# Patient Record
Sex: Female | Born: 1940 | Race: White | Hispanic: No | Marital: Married | Smoking: Never smoker
Health system: Southern US, Community
[De-identification: ages and names within clinical notes are randomized; demographics above are authoritative.]

## PROBLEM LIST (undated history)

## (undated) DIAGNOSIS — I10 Essential (primary) hypertension: Secondary | ICD-10-CM

## (undated) DIAGNOSIS — E7800 Pure hypercholesterolemia, unspecified: Secondary | ICD-10-CM

---

## 2019-07-01 ENCOUNTER — Emergency Department (HOSPITAL_BASED_OUTPATIENT_CLINIC_OR_DEPARTMENT_OTHER): Payer: Medicare Other

## 2019-07-01 ENCOUNTER — Emergency Department (HOSPITAL_BASED_OUTPATIENT_CLINIC_OR_DEPARTMENT_OTHER)
Admission: EM | Admit: 2019-07-01 | Discharge: 2019-07-01 | Disposition: A | Discharge status: Home / Self Care | Payer: Medicare Other | Attending: Emergency Medicine | Admitting: Emergency Medicine

## 2019-07-01 ENCOUNTER — Encounter (HOSPITAL_BASED_OUTPATIENT_CLINIC_OR_DEPARTMENT_OTHER): Payer: Self-pay | Admitting: *Deleted

## 2019-07-01 ENCOUNTER — Other Ambulatory Visit: Payer: Self-pay

## 2019-07-01 DIAGNOSIS — R1030 Lower abdominal pain, unspecified: Secondary | ICD-10-CM | POA: Diagnosis not present

## 2019-07-01 DIAGNOSIS — I10 Essential (primary) hypertension: Secondary | ICD-10-CM | POA: Insufficient documentation

## 2019-07-01 DIAGNOSIS — R102 Pelvic and perineal pain: Secondary | ICD-10-CM

## 2019-07-01 DIAGNOSIS — R109 Unspecified abdominal pain: Secondary | ICD-10-CM | POA: Diagnosis present

## 2019-07-01 HISTORY — DX: Essential (primary) hypertension: I10

## 2019-07-01 HISTORY — DX: Pure hypercholesterolemia, unspecified: E78.00

## 2019-07-01 LAB — CBC WITH DIFFERENTIAL/PLATELET
Abs Immature Granulocytes: 0.03 10*3/uL (ref 0.00–0.07)
Basophils Absolute: 0 10*3/uL (ref 0.0–0.1)
Basophils Relative: 1 %
Eosinophils Absolute: 0.1 10*3/uL (ref 0.0–0.5)
Eosinophils Relative: 1 %
HCT: 45.7 % (ref 36.0–46.0)
Hemoglobin: 13.9 g/dL (ref 12.0–15.0)
Immature Granulocytes: 0 %
Lymphocytes Relative: 25 %
Lymphs Abs: 1.9 10*3/uL (ref 0.7–4.0)
MCH: 26.6 pg (ref 26.0–34.0)
MCHC: 30.4 g/dL (ref 30.0–36.0)
MCV: 87.4 fL (ref 80.0–100.0)
Monocytes Absolute: 0.7 10*3/uL (ref 0.1–1.0)
Monocytes Relative: 9 %
Neutro Abs: 4.9 10*3/uL (ref 1.7–7.7)
Neutrophils Relative %: 64 %
Platelets: 216 10*3/uL (ref 150–400)
RBC: 5.23 MIL/uL — ABNORMAL HIGH (ref 3.87–5.11)
RDW: 14 % (ref 11.5–15.5)
WBC: 7.6 10*3/uL (ref 4.0–10.5)
nRBC: 0 % (ref 0.0–0.2)

## 2019-07-01 LAB — URINALYSIS, MICROSCOPIC (REFLEX)

## 2019-07-01 LAB — URINALYSIS, ROUTINE W REFLEX MICROSCOPIC
Bilirubin Urine: NEGATIVE
Glucose, UA: NEGATIVE mg/dL
Ketones, ur: NEGATIVE mg/dL
Leukocytes,Ua: NEGATIVE
Nitrite: NEGATIVE
Protein, ur: NEGATIVE mg/dL
Specific Gravity, Urine: <1.005 — ABNORMAL LOW (ref 1.005–1.030)
pH: 6 (ref 5.0–8.0)

## 2019-07-01 LAB — BASIC METABOLIC PANEL
Anion gap: 12 (ref 5–15)
BUN: 15 mg/dL (ref 8–23)
CO2: 25 mmol/L (ref 22–32)
Calcium: 9.1 mg/dL (ref 8.9–10.3)
Chloride: 104 mmol/L (ref 98–111)
Creatinine, Ser: 0.98 mg/dL (ref 0.44–1.00)
GFR calc Af Amer: >60 mL/min (ref >60)
GFR calc non Af Amer: 55 mL/min — ABNORMAL LOW (ref >60)
Glucose, Bld: 113 mg/dL — ABNORMAL HIGH (ref 70–99)
Potassium: 3.8 mmol/L (ref 3.5–5.1)
Sodium: 141 mmol/L (ref 135–145)

## 2019-07-01 MED ORDER — IOHEXOL 300 MG/ML  SOLN
100.0000 mL | Freq: Once | INTRAMUSCULAR | Status: AC
  Administered 2019-07-01: 80 mL via INTRAVENOUS

## 2019-07-01 MED ORDER — KETOROLAC TROMETHAMINE 15 MG/ML IJ SOLN
7.5000 mg | Freq: Once | INTRAMUSCULAR | Status: AC
  Administered 2019-07-01: 05:00:00 7.5 mg via INTRAVENOUS

## 2019-07-01 MED ORDER — KETOROLAC TROMETHAMINE 15 MG/ML IJ SOLN
INTRAMUSCULAR | Status: AC
  Filled 2019-07-01: qty 1

## 2019-07-01 MED ORDER — MELOXICAM 7.5 MG PO TABS: ORAL_TABLET | ORAL | 0 refills | Status: DC

## 2019-07-01 MED ORDER — FENTANYL CITRATE (PF) 100 MCG/2ML IJ SOLN
50.0000 ug | INTRAMUSCULAR | Status: DC | PRN
  Administered 2019-07-01: 03:00:00 50 ug via INTRAVENOUS
  Filled 2019-07-01: qty 2

## 2019-07-01 MED ORDER — SODIUM CHLORIDE 0.9 % IV SOLN
Freq: Once | INTRAVENOUS | Status: AC
  Administered 2019-07-01: 03:00:00 via INTRAVENOUS

## 2019-07-01 MED ORDER — ONDANSETRON HCL 4 MG/2ML IJ SOLN
4.0000 mg | Freq: Once | INTRAMUSCULAR | Status: AC
  Administered 2019-07-01: 4 mg via INTRAVENOUS
  Filled 2019-07-01: qty 2

## 2019-07-01 NOTE — ED Notes (Signed)
Updated pt's husband on pt condition and plan of care

## 2019-07-01 NOTE — ED Triage Notes (Signed)
Pt reports pubic pain with tenderness on palpation and movement x 2-3 days. Also reports low back pain. Denies urinary symptoms.

## 2019-07-01 NOTE — ED Provider Notes (Signed)
[facility] Provider Note: Georgena Spurling, MD, FACEP  CSN: [id number] MRN: [id number] ARRIVAL: 07/01/19 at [place]: [place]  Abdominal Pain   HISTORY OF PRESENT ILLNESS  07/01/19 2:26 AM Susan Mayer is a 78 y.o. female with 2 to 3 days of suprapubic pain.  The pain is dull in nature and worse with movement or palpation.  The pain acutely worsened this morning about 1 AM and she rates it as a 7 out of 10 at its worst; it is much better when lying still.  She has had some transient nausea and generalized shaking this morning as well.  She denies vomiting or diarrhea.  She denies dysuria or hematuria.  She denies vaginal bleeding or discharge; she is postmenopausal.  She has a history of chronic back pain but states her back pain has worsened along with the development of pain in her suprapubic region.     Past Medical History:  Diagnosis Date  . Hypercholesteremia   . Hypertension     History reviewed. No pertinent surgical history.  History reviewed. No pertinent family history.  Social History   Tobacco Use  . Smoking status: Never Smoker  . Smokeless tobacco: Never Used  Substance Use Topics  . Alcohol use: Never    Frequency: Never  . Drug use: Never    Prior to Admission medications   Not on File    Allergies Penicillins and Sulfa antibiotics   REVIEW OF SYSTEMS  Negative except as noted here or in the History of Present Illness.   PHYSICAL EXAMINATION  Initial Vital Signs Blood pressure (!) 144/93, pulse 98, temperature 99 F (37.2 C), temperature source Oral, resp. rate 18, height 5\' 5"  (1.651 m), weight 63.5 kg, SpO2 100 %.  Examination General: Well-developed, well-nourished female in no acute distress; appearance consistent with age of record HENT: normocephalic; atraumatic Eyes: pupils equal, round and reactive to light; extraocular muscles intact; bilateral pseudophakia Neck: supple Heart: regular rate and  rhythm Lungs: clear to auscultation bilaterally Abdomen: soft; nondistended; suprapubic tenderness; no masses or hepatosplenomegaly; bowel sounds present Extremities: No deformity; full range of motion; pulses normal Neurologic: Awake, alert and oriented; motor function intact in all extremities and symmetric; no facial droop Skin: Warm and dry Psychiatric: Normal mood and affect   RESULTS  Summary of this visit's results, reviewed by myself:   EKG Interpretation  Date/Time:    Ventricular Rate:    PR Interval:    QRS Duration:   QT Interval:    QTC Calculation:   R Axis:     Text Interpretation:        Laboratory Studies: Results for orders placed or performed during the hospital encounter of 07/01/19 (from the past 24 hour(s))  Urinalysis, Routine w reflex microscopic     Status: Abnormal   Collection Time: 07/01/19  2:42 AM  Result Value Ref Range   Color, Urine YELLOW YELLOW   APPearance CLEAR CLEAR   Specific Gravity, Urine <1.005 (L) 1.005 - 1.030   pH 6.0 5.0 - 8.0   Glucose, UA NEGATIVE NEGATIVE mg/dL   Hgb urine dipstick TRACE (A) NEGATIVE   Bilirubin Urine NEGATIVE NEGATIVE   Ketones, ur NEGATIVE NEGATIVE mg/dL   Protein, ur NEGATIVE NEGATIVE mg/dL   Nitrite NEGATIVE NEGATIVE   Leukocytes,Ua NEGATIVE NEGATIVE  CBC with Differential/Platelet     Status: Abnormal   Collection Time: 07/01/19  2:42 AM  Result Value Ref Range   WBC 7.6 4.0 -  10.5 K/uL   RBC 5.23 (H) 3.87 - 5.11 MIL/uL   Hemoglobin 13.9 12.0 - 15.0 g/dL   HCT 16.145.7 09.636.0 - 04.546.0 %   MCV 87.4 80.0 - 100.0 fL   MCH 26.6 26.0 - 34.0 pg   MCHC 30.4 30.0 - 36.0 g/dL   RDW 40.914.0 81.111.5 - 91.415.5 %   Platelets 216 150 - 400 K/uL   nRBC 0.0 0.0 - 0.2 %   Neutrophils Relative % 64 %   Neutro Abs 4.9 1.7 - 7.7 K/uL   Lymphocytes Relative 25 %   Lymphs Abs 1.9 0.7 - 4.0 K/uL   Monocytes Relative 9 %   Monocytes Absolute 0.7 0.1 - 1.0 K/uL   Eosinophils Relative 1 %   Eosinophils Absolute 0.1 0.0 - 0.5  K/uL   Basophils Relative 1 %   Basophils Absolute 0.0 0.0 - 0.1 K/uL   Immature Granulocytes 0 %   Abs Immature Granulocytes 0.03 0.00 - 0.07 K/uL  Basic metabolic panel     Status: Abnormal   Collection Time: 07/01/19  2:42 AM  Result Value Ref Range   Sodium 141 135 - 145 mmol/L   Potassium 3.8 3.5 - 5.1 mmol/L   Chloride 104 98 - 111 mmol/L   CO2 25 22 - 32 mmol/L   Glucose, Bld 113 (H) 70 - 99 mg/dL   BUN 15 8 - 23 mg/dL   Creatinine, Ser 7.820.98 0.44 - 1.00 mg/dL   Calcium 9.1 8.9 - 95.610.3 mg/dL   GFR calc non Af Amer 55 (L) >60 mL/min   GFR calc Af Amer >60 >60 mL/min   Anion gap 12 5 - 15  Urinalysis, Microscopic (reflex)     Status: Abnormal   Collection Time: 07/01/19  2:42 AM  Result Value Ref Range   RBC / HPF 0-5 0 - 5 RBC/hpf   WBC, UA 0-5 0 - 5 WBC/hpf   Bacteria, UA RARE (A) NONE SEEN   Squamous Epithelial / LPF 0-5 0 - 5   Imaging Studies: Ct Abdomen Pelvis W Contrast  Result Date: 07/01/2019 CLINICAL DATA:  Suprapubic pain.  Nausea and chills. EXAM: CT ABDOMEN AND PELVIS WITH CONTRAST TECHNIQUE: Multidetector CT imaging of the abdomen and pelvis was performed using the standard protocol following bolus administration of intravenous contrast. CONTRAST:  80mL OMNIPAQUE IOHEXOL 300 MG/ML  SOLN COMPARISON:  None. FINDINGS: Lower chest: The lung bases are clear. The heart size is normal. Hepatobiliary: The liver is normal. Normal gallbladder.There is no biliary ductal dilation. Pancreas: Normal contours without ductal dilatation. No peripancreatic fluid collection. Spleen: No splenic laceration or hematoma. Adrenals/Urinary Tract: --Adrenal glands: No adrenal hemorrhage. --Right kidney/ureter: No hydronephrosis or perinephric hematoma. --Left kidney/ureter: No hydronephrosis or perinephric hematoma. --Urinary bladder: Unremarkable. Stomach/Bowel: --Stomach/Duodenum: No hiatal hernia or other gastric abnormality. Normal duodenal course and caliber. --Small bowel: No dilatation or  inflammation. --Colon: No focal abnormality. --Appendix: Normal. Vascular/Lymphatic: Atherosclerotic calcification is present within the non-aneurysmal abdominal aorta, without hemodynamically significant stenosis. --No retroperitoneal lymphadenopathy. --No mesenteric lymphadenopathy. --No pelvic or inguinal lymphadenopathy. Reproductive: Unremarkable Other: No ascites or free air. There is a small fat containing periumbilical hernia. Musculoskeletal. There is grade 1 anterolisthesis of L3 on L4. There is multilevel disc height loss. No acute osseous abnormality. IMPRESSION: No acute intra-abdominal abnormality detected. No findings to explain the patient's suprapubic abdominal pain. Electronically Signed   By: Katherine Mantlehristopher  Green M.D.   On: 07/01/2019 03:52    ED COURSE and MDM  Nursing notes  and initial vitals signs, including pulse oximetry, reviewed.  Vitals:   07/01/19 0221 07/01/19 0222 07/01/19 0441  BP: (!) 144/93  125/79  Pulse: 98  67  Resp: 18  16  Temp: 99 F (37.2 C)  98.6 F (37 C)  TempSrc: Oral  Oral  SpO2: 100%  100%  Weight:  63.5 kg   Height:  5\' 5"  (1.651 m)    4:28 AM CT scan and laboratory studies are unremarkable.  On reexamination the patient has no tenderness of her labia majora but does have tenderness of the suprapubic and mons pubis regions without erythema, swelling, warmth or crepitus.  There is tenderness of the right lower back just medial to the sacroiliac joint.  This could represent sacroiliac pain radiating to the anterior but the pattern is atypical for sacroiliitis.  Examination is not consistent with cellulitis or other soft tissue infection and I would have expected to see evidence of such on the CT scan.  5:24 AM Significant relief with Toradol 7.5 mg IV.  We will have patient follow-up with her PCP or return if symptoms are worsening or changing.  PROCEDURES    ED DIAGNOSES     ICD-10-CM   1. Suprapubic abdominal pain  R10.2        Lalena Salas,  Jonny RuizJohn, MD 07/01/19 [phone]

## 2019-07-01 NOTE — ED Notes (Signed)
Went to parking lot and updated pt's husband on pt condition and plan of care

## 2019-07-01 NOTE — ED Notes (Signed)
Patient transported to CT 

## 2020-07-27 IMAGING — CT CT ABDOMEN AND PELVIS WITH CONTRAST
2 of 5 series · 16 of 46 positions shown, 18 images · IV contrast (APPLIED)
Comparison: None.

CLINICAL DATA: Suprapubic pain.  Nausea and chills.

EXAM:
CT ABDOMEN AND PELVIS WITH CONTRAST
TECHNIQUE: Multidetector CT imaging of the abdomen and pelvis was performed
using the standard protocol following bolus administration of
intravenous contrast.
CONTRAST:  80mL OMNIPAQUE IOHEXOL 300 MG/ML  SOLN

[Series 2: axial st · axial · 0.65mm/px · z∈[+890,+1255]mm · 13 of 83 slices shown, 15 images]
[im 5/83  soft-tissue]
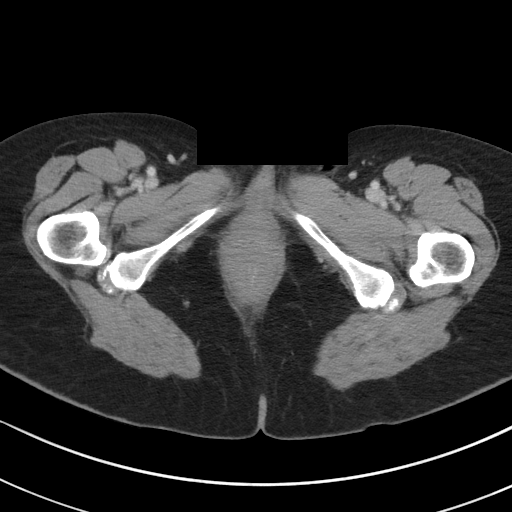
[im 5/83  bone]
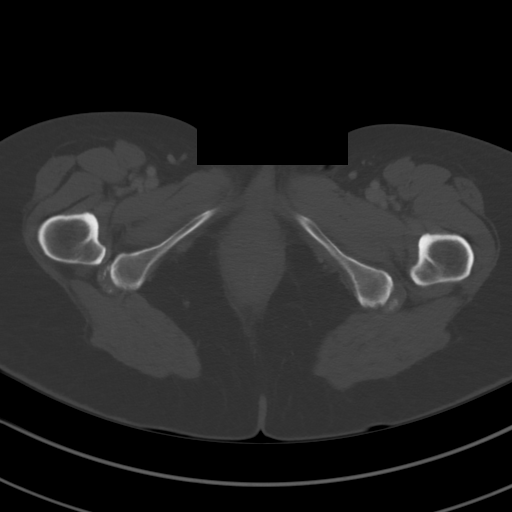
[im 13/83  soft-tissue]
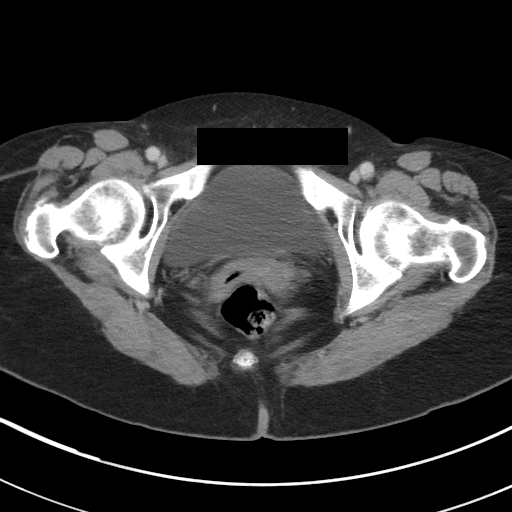
[im 17/83  soft-tissue]
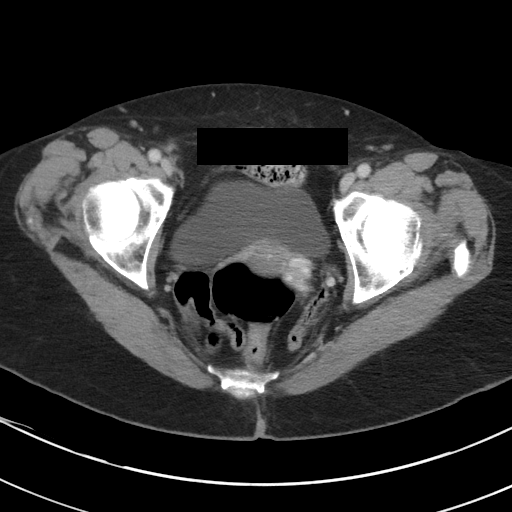
[im 25/83  soft-tissue]
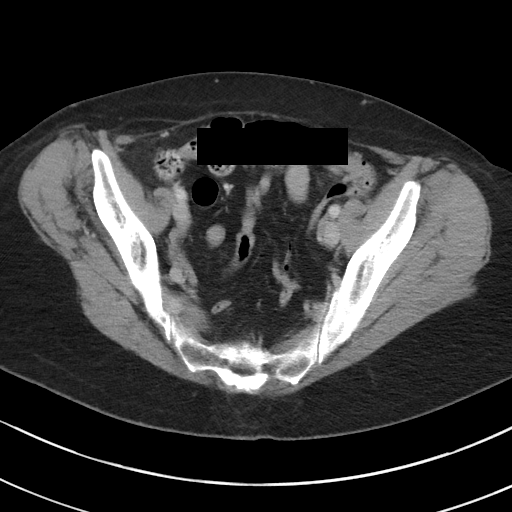
[im 29/83  soft-tissue]
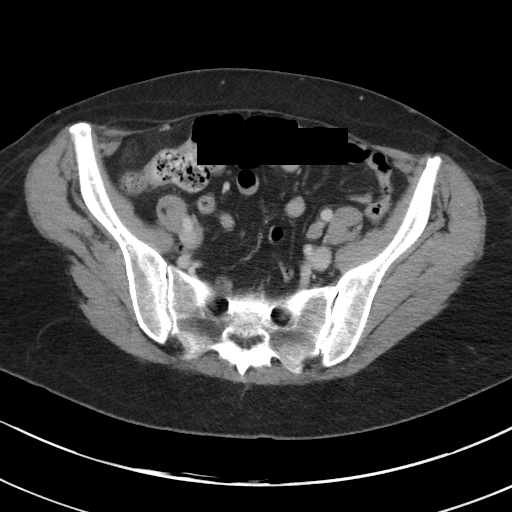
[im 37/83  soft-tissue]
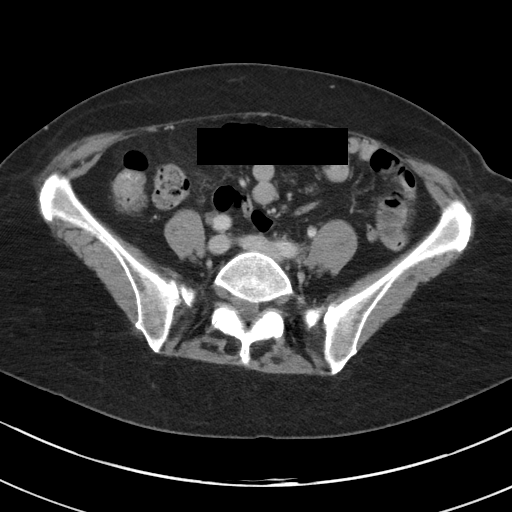
[im 42/83  soft-tissue]
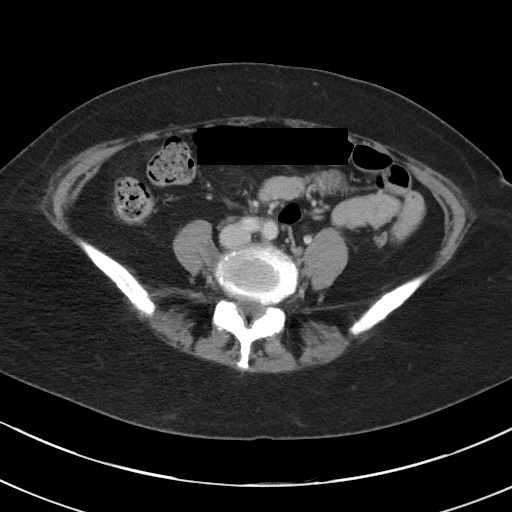
[im 46/83  soft-tissue]
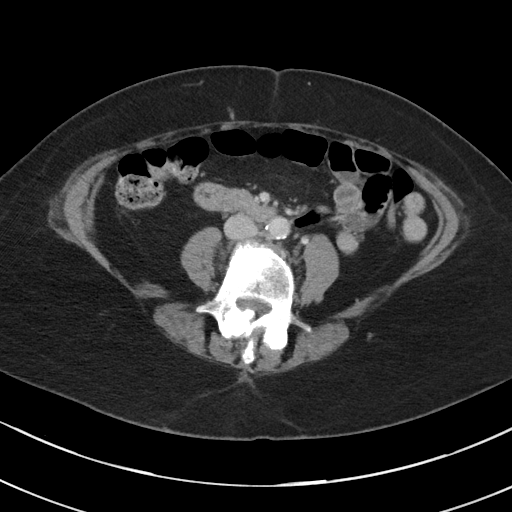
[im 54/83  soft-tissue]
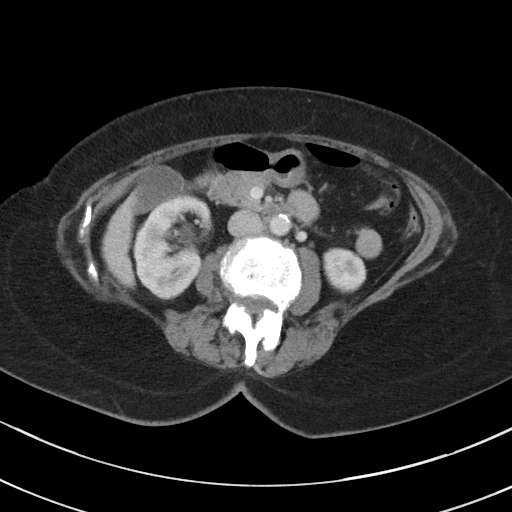
[im 54/83  bone]
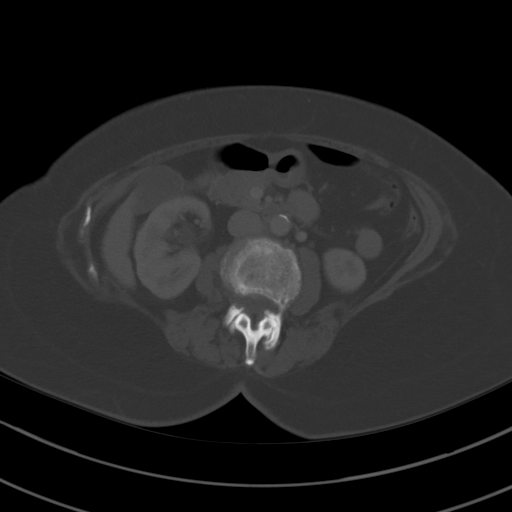
[im 58/83  soft-tissue]
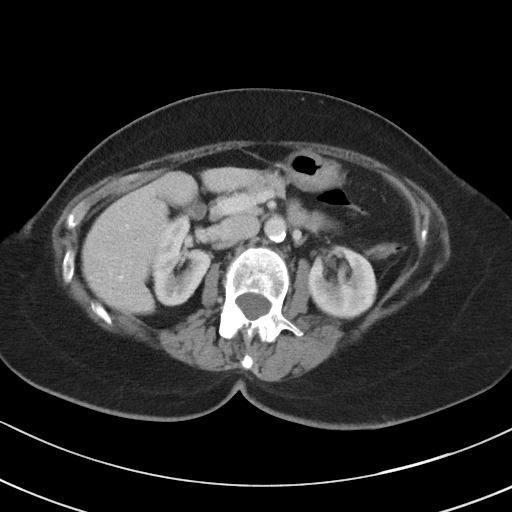
[im 66/83  soft-tissue]
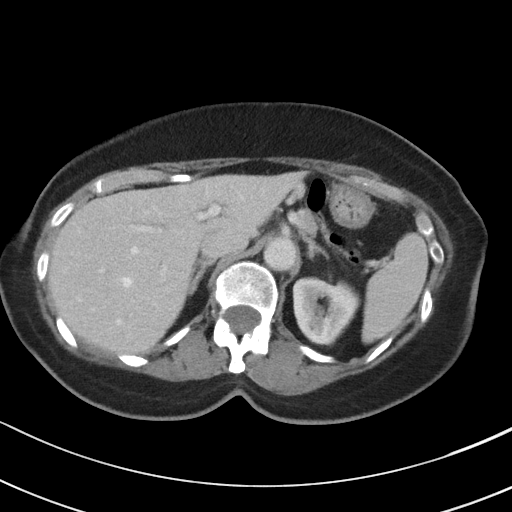
[im 70/83  soft-tissue]
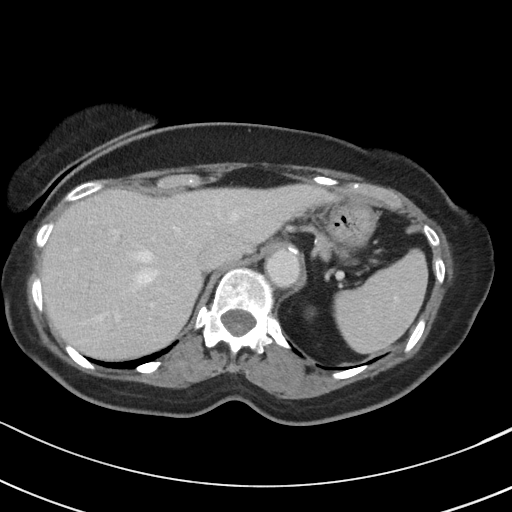
[im 78/83  soft-tissue]
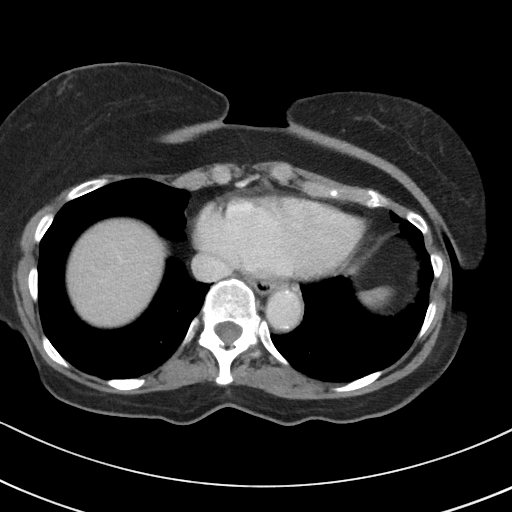

[Series 5: coronal st · coronal · 0.65mm/px · 3 of 83 slices shown]
[im 28/83  soft-tissue]
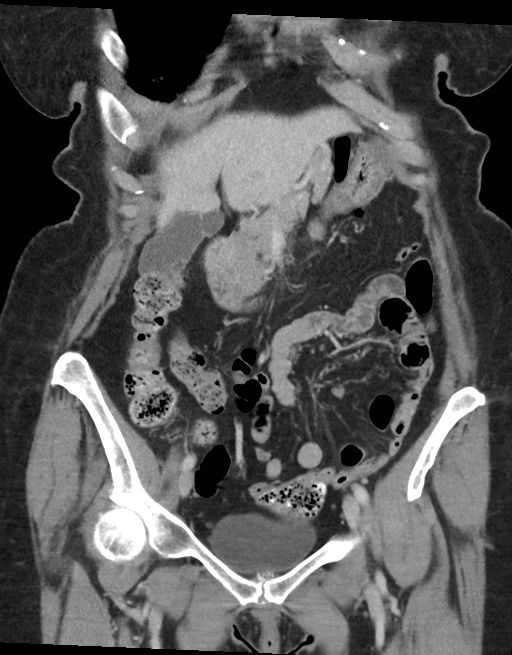
[im 37/83  soft-tissue]
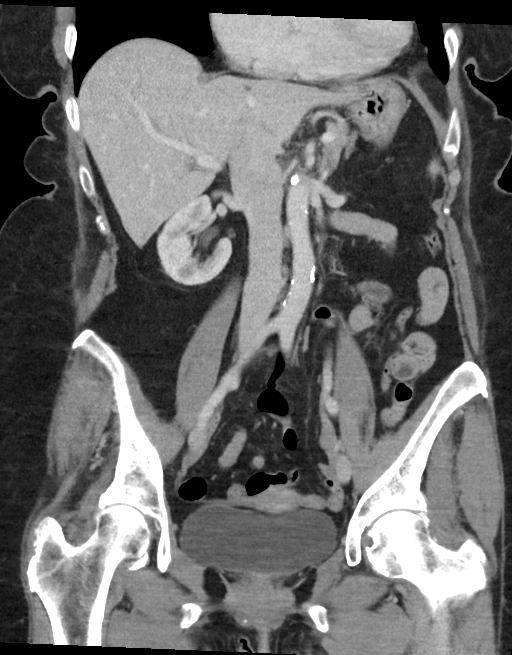
[im 46/83  soft-tissue]
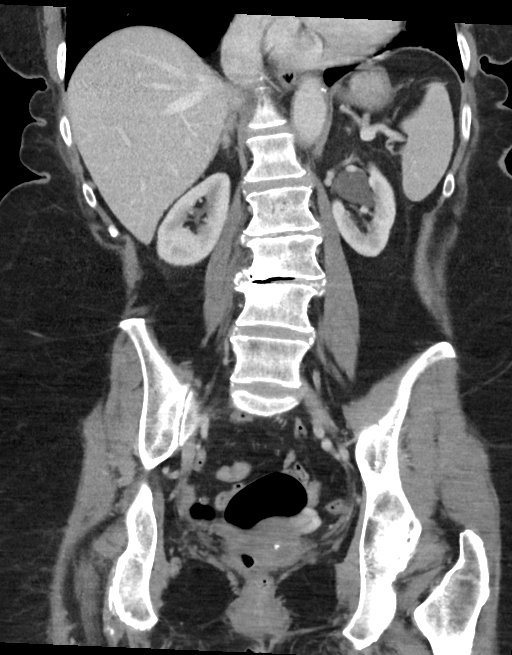

[16 of 46 positions shown; findings below may reference images not displayed]

FINDINGS: Lower chest: The lung bases are clear. The heart size is normal.

Hepatobiliary: The liver is normal. Normal gallbladder.There is no
biliary ductal dilation.

Pancreas: Normal contours without ductal dilatation. No
peripancreatic fluid collection.

Spleen: No splenic laceration or hematoma.

Adrenals/Urinary Tract:

--Adrenal glands: No adrenal hemorrhage.

--Right kidney/ureter: No hydronephrosis or perinephric hematoma.

--Left kidney/ureter: No hydronephrosis or perinephric hematoma.

--Urinary bladder: Unremarkable.

Stomach/Bowel:

--Stomach/Duodenum: No hiatal hernia or other gastric abnormality.
Normal duodenal course and caliber.

--Small bowel: No dilatation or inflammation.

--Colon: No focal abnormality.

--Appendix: Normal.

Vascular/Lymphatic: Atherosclerotic calcification is present within
the non-aneurysmal abdominal aorta, without hemodynamically
significant stenosis.

--No retroperitoneal lymphadenopathy.

--No mesenteric lymphadenopathy.

--No pelvic or inguinal lymphadenopathy.

Reproductive: Unremarkable

Other: No ascites or free air. There is a small fat containing
periumbilical hernia.

Musculoskeletal. There is grade 1 anterolisthesis of L3 on L4. There
is multilevel disc height loss. No acute osseous abnormality.
IMPRESSION: No acute intra-abdominal abnormality detected. No findings to
explain the patient's suprapubic abdominal pain.

## 2021-02-14 ENCOUNTER — Emergency Department (HOSPITAL_BASED_OUTPATIENT_CLINIC_OR_DEPARTMENT_OTHER)
Admission: EM | Admit: 2021-02-14 | Discharge: 2021-02-14 | Disposition: A | Discharge status: Home / Self Care | Payer: Medicare Other | Attending: Emergency Medicine | Admitting: Emergency Medicine

## 2021-02-14 ENCOUNTER — Other Ambulatory Visit (HOSPITAL_COMMUNITY): Payer: Self-pay | Admitting: Emergency Medicine

## 2021-02-14 ENCOUNTER — Other Ambulatory Visit: Payer: Self-pay

## 2021-02-14 ENCOUNTER — Emergency Department (HOSPITAL_BASED_OUTPATIENT_CLINIC_OR_DEPARTMENT_OTHER): Payer: Medicare Other

## 2021-02-14 ENCOUNTER — Encounter (HOSPITAL_BASED_OUTPATIENT_CLINIC_OR_DEPARTMENT_OTHER): Payer: Self-pay

## 2021-02-14 DIAGNOSIS — R1084 Generalized abdominal pain: Secondary | ICD-10-CM | POA: Insufficient documentation

## 2021-02-14 DIAGNOSIS — R630 Anorexia: Secondary | ICD-10-CM | POA: Diagnosis not present

## 2021-02-14 DIAGNOSIS — I10 Essential (primary) hypertension: Secondary | ICD-10-CM | POA: Insufficient documentation

## 2021-02-14 DIAGNOSIS — R110 Nausea: Secondary | ICD-10-CM | POA: Diagnosis not present

## 2021-02-14 DIAGNOSIS — R1012 Left upper quadrant pain: Secondary | ICD-10-CM | POA: Insufficient documentation

## 2021-02-14 DIAGNOSIS — Z79899 Other long term (current) drug therapy: Secondary | ICD-10-CM | POA: Insufficient documentation

## 2021-02-14 LAB — URINALYSIS, ROUTINE W REFLEX MICROSCOPIC
Bilirubin Urine: NEGATIVE
Glucose, UA: NEGATIVE mg/dL
Ketones, ur: NEGATIVE mg/dL
Leukocytes,Ua: NEGATIVE
Nitrite: NEGATIVE
Protein, ur: NEGATIVE mg/dL
Specific Gravity, Urine: 1.01 (ref 1.005–1.030)
pH: 6 (ref 5.0–8.0)

## 2021-02-14 LAB — COMPREHENSIVE METABOLIC PANEL
ALT: 16 U/L (ref 0–44)
AST: 25 U/L (ref 15–41)
Albumin: 4.6 g/dL (ref 3.5–5.0)
Alkaline Phosphatase: 62 U/L (ref 38–126)
Anion gap: 10 (ref 5–15)
BUN: 14 mg/dL (ref 8–23)
CO2: 28 mmol/L (ref 22–32)
Calcium: 9.6 mg/dL (ref 8.9–10.3)
Chloride: 103 mmol/L (ref 98–111)
Creatinine, Ser: 0.84 mg/dL (ref 0.44–1.00)
GFR, Estimated: >60 mL/min (ref >60)
Glucose, Bld: 114 mg/dL — ABNORMAL HIGH (ref 70–99)
Potassium: 3.4 mmol/L — ABNORMAL LOW (ref 3.5–5.1)
Sodium: 141 mmol/L (ref 135–145)
Total Bilirubin: 0.2 mg/dL — ABNORMAL LOW (ref 0.3–1.2)
Total Protein: 7.5 g/dL (ref 6.5–8.1)

## 2021-02-14 LAB — URINALYSIS, MICROSCOPIC (REFLEX): WBC, UA: NONE SEEN WBC/hpf (ref 0–5)

## 2021-02-14 LAB — CBC
HCT: 48.5 % — ABNORMAL HIGH (ref 36.0–46.0)
Hemoglobin: 15.1 g/dL — ABNORMAL HIGH (ref 12.0–15.0)
MCH: 27.1 pg (ref 26.0–34.0)
MCHC: 31.1 g/dL (ref 30.0–36.0)
MCV: 87.1 fL (ref 80.0–100.0)
Platelets: 249 10*3/uL (ref 150–400)
RBC: 5.57 MIL/uL — ABNORMAL HIGH (ref 3.87–5.11)
RDW: 14.4 % (ref 11.5–15.5)
WBC: 10.4 10*3/uL (ref 4.0–10.5)
nRBC: 0 % (ref 0.0–0.2)

## 2021-02-14 LAB — LIPASE, BLOOD: Lipase: 38 U/L (ref 11–51)

## 2021-02-14 LAB — OCCULT BLOOD X 1 CARD TO LAB, STOOL: Fecal Occult Bld: NEGATIVE

## 2021-02-14 MED ORDER — ONDANSETRON HCL 4 MG/2ML IJ SOLN
4.0000 mg | Freq: Once | INTRAMUSCULAR | Status: AC
  Administered 2021-02-14: 4 mg via INTRAVENOUS
  Filled 2021-02-14: qty 2

## 2021-02-14 MED ORDER — FAMOTIDINE IN NACL 20-0.9 MG/50ML-% IV SOLN
20.0000 mg | Freq: Once | INTRAVENOUS | Status: AC
  Administered 2021-02-14: 20 mg via INTRAVENOUS
  Filled 2021-02-14: qty 50

## 2021-02-14 MED ORDER — DICYCLOMINE HCL 20 MG PO TABS: 20.0000 mg | ORAL_TABLET | Freq: Two times a day (BID) | ORAL | 0 refills | Status: DC

## 2021-02-14 MED ORDER — ACETAMINOPHEN 325 MG PO TABS
650.0000 mg | ORAL_TABLET | Freq: Once | ORAL | Status: AC
  Administered 2021-02-14: 650 mg via ORAL
  Filled 2021-02-14: qty 2

## 2021-02-14 MED ORDER — IOHEXOL 300 MG/ML  SOLN
100.0000 mL | Freq: Once | INTRAMUSCULAR | Status: AC | PRN
  Administered 2021-02-14: 100 mL via INTRAVENOUS

## 2021-02-14 MED ORDER — SUCRALFATE 1 G PO TABS: 1.0000 g | ORAL_TABLET | Freq: Three times a day (TID) | ORAL | 0 refills | Status: DC

## 2021-02-14 MED FILL — DICYCLOMINE 20 MG TABLET: 20 | 10 days supply | Qty: 20 | Fill #0

## 2021-02-14 MED FILL — SUCRALFATE 1 GM TABLET: 1 | 4 days supply | Qty: 14 | Fill #0

## 2021-02-14 NOTE — ED Provider Notes (Signed)
[facility] EMERGENCY DEPARTMENT Provider Note   CSN: [id number] Arrival date & time: 02/14/21  1159     History Chief Complaint  Patient presents with  . Abdominal Pain    Susan Sheldonnne Mayer is a 80 y.o. female.  HPI Patient is an 80 year old female with past medical significant for hypertension hypercholesterolemia.  She states that she has had 3 weeks of intermittent abdominal pain which seems to come and go seems to sometimes be worse with eating although she will occasionally have a period of improvement in her pain with eating.  She denies any blood in her stool but does state that she has had somewhat dark, tarry appearing poop over the past 2 days.  She states that this morning she woke up and soon after she got out of bed began having severe generalized abdominal pain was sharp and stabbing felt like she was being stabbed with a knife.  She states that she had nausea and no vomiting.  She states that she usually does not have much nausea with her symptoms.  She states that she has been experiencing weight loss over the past several months she attributes this somewhat to decreased appetite and to the recent passing away of her husband.  She now lives alone but her daughter lives in [place] and is very supportive of her.  She denies any fevers or chills, symptoms of urinary tract infection such as frequency, urgency or dysuria or hematuria.  She states that her pain seems improved at this point to a 4/10 whereas it was a 10/10 earlier this morning.     Past Medical History:  Diagnosis Date  . Hypercholesteremia   . Hypertension     There are no problems to display for this patient.   History reviewed. No pertinent surgical history.   OB History   No obstetric history on file.     No family history on file.  Social History   Tobacco Use  . Smoking status: Never Smoker  . Smokeless tobacco: Never Used  Vaping Use  . Vaping Use: Never used  Substance Use  Topics  . Alcohol use: Never  . Drug use: Never    Home Medications Prior to Admission medications   Medication Sig Start Date End Date Taking? Authorizing Provider  dicyclomine (BENTYL) 20 MG tablet Take 1 tablet (20 mg total) by mouth 2 (two) times daily. 02/14/21  Yes Fondaw, Stevphen MeuseWylder S, PA  levothyroxine (SYNTHROID) 50 MCG tablet TAKE 1 TABLET BY MOUTH EVERY DAY AT 6 AM 11/30/15  Yes [provider]  losartan (COZAAR) 25 MG tablet TK 1 T PO QD 12/08/15  Yes [provider]  omeprazole (PRILOSEC) 40 MG capsule Take 1 capsule by mouth daily. 04/22/18  Yes [provider]  pravastatin (PRAVACHOL) 20 MG tablet Take 1 tablet by mouth daily. 11/30/15  Yes [provider]  sucralfate (CARAFATE) 1 g tablet Take 1 tablet (1 g total) by mouth 4 (four) times daily -  with meals and at bedtime. 02/14/21  Yes Gailen ShelterFondaw, Wylder S, PA    Allergies    Penicillins and Sulfa antibiotics  Review of Systems   Review of Systems  Constitutional: Positive for appetite change and unexpected weight change (weight change -- perhaps 2/2 dec PO intake). Negative for chills and fever.  HENT: Negative for congestion.   Eyes: Negative for pain.  Respiratory: Negative for cough and shortness of breath.   Cardiovascular: Negative for chest pain and leg swelling.  Gastrointestinal:  Positive for abdominal pain and nausea. Negative for diarrhea and vomiting.  Genitourinary: Negative for dysuria.  Musculoskeletal: Negative for myalgias.  Skin: Negative for rash.  Neurological: Negative for dizziness and headaches.    Physical Exam Updated Vital Signs BP 134/82 (BP Location: Right Arm)   Pulse 72   Temp 97.7 F (36.5 C) (Oral)   Resp 16   Ht 5\' 4"  (1.626 m)   Wt 60.1 kg   SpO2 98%   BMI 22.74 kg/m   Physical Exam Vitals and nursing note reviewed.  Constitutional:      General: She is not in acute distress.    Comments: Patient is pleasant well-appearing 80 year old female no  acute distress.  Does not appear overly uncomfortable. Able answer questions appropriately follow commands.  HENT:     Head: Normocephalic and atraumatic.     Nose: Nose normal.  Eyes:     General: No scleral icterus. Cardiovascular:     Rate and Rhythm: Normal rate and regular rhythm.     Pulses: Normal pulses.     Heart sounds: Normal heart sounds.  Pulmonary:     Effort: Pulmonary effort is normal. No respiratory distress.     Breath sounds: No wheezing.  Abdominal:     Palpations: Abdomen is soft.     Tenderness: There is abdominal tenderness in the epigastric area and left upper quadrant. There is no right CVA tenderness or left CVA tenderness.     Comments: Moderate diffuse abdominal tenderness.  No guarding or palpable masses.  No rebound tenderness.  Tenderness is most pronounced over the epigastrium and left upper quadrant.  Musculoskeletal:     Cervical back: Normal range of motion.     Right lower leg: No edema.     Left lower leg: No edema.  Skin:    General: Skin is warm and dry.     Capillary Refill: Capillary refill takes less than 2 seconds.  Neurological:     Mental Status: She is alert. Mental status is at baseline.  Psychiatric:        Mood and Affect: Mood normal.        Behavior: Behavior normal.     ED Results / Procedures / Treatments   Labs (all labs ordered are listed, but only abnormal results are displayed) Labs Reviewed  COMPREHENSIVE METABOLIC PANEL - Abnormal; Notable for the following components:      Result Value   Potassium 3.4 (*)    Glucose, Bld 114 (*)    Total Bilirubin 0.2 (*)    All other components within normal limits  CBC - Abnormal; Notable for the following components:   RBC 5.57 (*)    Hemoglobin 15.1 (*)    HCT 48.5 (*)    All other components within normal limits  URINALYSIS, ROUTINE W REFLEX MICROSCOPIC - Abnormal; Notable for the following components:   Color, Urine STRAW (*)    Hgb urine dipstick TRACE (*)    All  other components within normal limits  URINALYSIS, MICROSCOPIC (REFLEX) - Abnormal; Notable for the following components:   Bacteria, UA RARE (*)    All other components within normal limits  LIPASE, BLOOD  OCCULT BLOOD X 1 CARD TO LAB, STOOL    EKG None  Radiology CT ABDOMEN PELVIS W CONTRAST  Result Date: 02/14/2021 CLINICAL DATA:  Abdominal pain, weight loss, concern for malignancy EXAM: CT ABDOMEN AND PELVIS WITH CONTRAST TECHNIQUE: Multidetector CT imaging of the abdomen and pelvis was performed using  the standard protocol following bolus administration of intravenous contrast. CONTRAST:  OMNIPAQUE IOHEXOL 300 MG/ML  SOLN COMPARISON:  07/01/2019 FINDINGS: Lower chest: No acute abnormality. Hepatobiliary: No solid liver abnormality is seen. No gallstones, gallbladder wall thickening, or biliary dilatation. Pancreas: Unremarkable. No pancreatic ductal dilatation or surrounding inflammatory changes. Spleen: Normal in size without significant abnormality. Adrenals/Urinary Tract: Adrenal glands are unremarkable. Kidneys are normal, without renal calculi, solid lesion, or hydronephrosis. Left-sided parapelvic cysts. Bladder is unremarkable. Stomach/Bowel: Stomach is within normal limits. Appendix is not clearly visualized and may be surgically absent. No evidence of bowel wall thickening, distention, or inflammatory changes. Vascular/Lymphatic: Aortic atherosclerosis. No enlarged abdominal or pelvic lymph nodes. Reproductive: No mass or other significant abnormality. Other: No abdominal wall hernia or abnormality. No abdominopelvic ascites. Musculoskeletal: No acute or significant osseous findings. IMPRESSION: 1. No acute CT findings of the abdomen or pelvis to explain abdominal pain or weight loss. 2. No evidence of mass or lymphadenopathy in the abdomen or pelvis. Aortic Atherosclerosis (ICD10-I70.0). Electronically Signed   By: Lauralyn Primes M.D.   On: 02/14/2021 15:37     Procedures Procedures   Medications Ordered in ED Medications  acetaminophen (TYLENOL) tablet 650 mg (650 mg Oral Given 02/14/21 1442)  famotidine (PEPCID) IVPB 20 mg premix (0 mg Intravenous Stopped 02/14/21 1545)  ondansetron (ZOFRAN) injection 4 mg (4 mg Intravenous Given 02/14/21 1442)  iohexol (OMNIPAQUE) 300 MG/ML solution 100 mL (100 mLs Intravenous Contrast Given 02/14/21 1458)    ED Course  I have reviewed the triage vital signs and the nursing notes.  Pertinent labs & imaging results that were available during my care of the patient were reviewed by me and considered in my medical decision making (see chart for details).  Patient is a well-appearing 80 year old female with past medical history discussed in HPI.  She is presented today with subacute abdominal pain is been ongoing for 3 weeks intermittently.  She had sudden onset worsening abdominal pain this morning which seems to have significantly improved at this time.  Given her age and that she has not had CT imaging done within the past year I do have some concern for malignancy.  I reviewed patient's last CT scan available in our EMR and it was without abnormality.  At that time she was having some lower abdominal pain which she describes as completely different from what she is experiencing now.  Abdominal exam is notable for epigastric and left upper quadrant tenderness but seems to be somewhat diffusely tender throughout abdomen on my initial evaluation.  No history of arrhythmia I doubt mesenteric ischemia, I also doubt AAA or descending thoracic aortic dissection.  Patient's vital signs are within normal limits I have higher suspicion for peptic ulcer disease given that she has somewhat dark appearing stool on my exam however her fecal occult was negative.  Some consideration given to H. pylori given that she has had some recent burping will refer to gastroenterology to discuss the possibility of this.  For now she  will continue omeprazole, given Bentyl, Carafate and recommended close follow-up with PCP and gastroenterology.  Blood work discussed below.  Clinical Course as of 02/14/21 1618  Fri Feb 14, 2021  1426 Comprehensive metabolic panel(!)  I personally reviewed all laboratory work and imaging.  Metabolic panel without any acute abnormality specifically kidney function within normal limits and no significant electrolyte abnormalities. CBC without leukocytosis or significant anemia.  [WF]    Clinical Course User Index [WF] Gailen Shelter,  PA  CT was reviewed by myself I agree of radiology read there is no abnormality notable.  On my reevaluation patient has no abdominal tenderness presently.  States he feels better after famotidine and Tylenol and Zofran.  Given that she has had no other episodes of nausea and no vomiting at all I offered Zofran but did not feel strongly that she would benefit from it.  MDM Rules/Calculators/A&P                          All questions answered best my ability.  Final Clinical Impression(s) / ED Diagnoses Final diagnoses:  Generalized abdominal pain    Rx / DC Orders ED Discharge Orders         Ordered    sucralfate (CARAFATE) 1 g tablet  3 times daily with meals & bedtime        02/14/21 1548    dicyclomine (BENTYL) 20 MG tablet  2 times daily        02/14/21 1548           Blanchie Dessert [place], [place] 02/14/21 1618    Rozelle Logan, DO 02/16/21 1093

## 2021-02-14 NOTE — ED Notes (Signed)
Chaperone for rectal exam with provider

## 2021-02-14 NOTE — ED Triage Notes (Signed)
Pt c/o intermittent abd pain x "couple weeks" with weight loss-pt states she was recently treated for UTI then later was advised she did not have a UTI and stopped taking rx abx-NAD-steady gait

## 2021-02-14 NOTE — Discharge Instructions (Addendum)
Please continue to take the omeprazole that you are prescribed.  I am also adding on Carafate and Bentyl which are to medications that should help with your symptoms.  Please take as prescribed.  Please follow-up with gastroenterology and her primary care doctor.

## 2022-03-13 IMAGING — CT CT ABD-PELV W/ CM
2 of 5 series · 17 of 46 positions shown, 19 images · IV contrast (Omnipaque)
Comparison: 07/01/2019

CLINICAL DATA: Abdominal pain, weight loss, concern for malignancy

EXAM:
CT ABDOMEN AND PELVIS WITH CONTRAST
TECHNIQUE: Multidetector CT imaging of the abdomen and pelvis was performed
using the standard protocol following bolus administration of
intravenous contrast.
CONTRAST:  100mL OMNIPAQUE IOHEXOL 300 MG/ML  SOLN

[Series 2: axial st · axial · 0.95mm/px · z∈[-406,-36]mm · 14 of 84 slices shown, 16 images]
[im 5/84  soft-tissue]
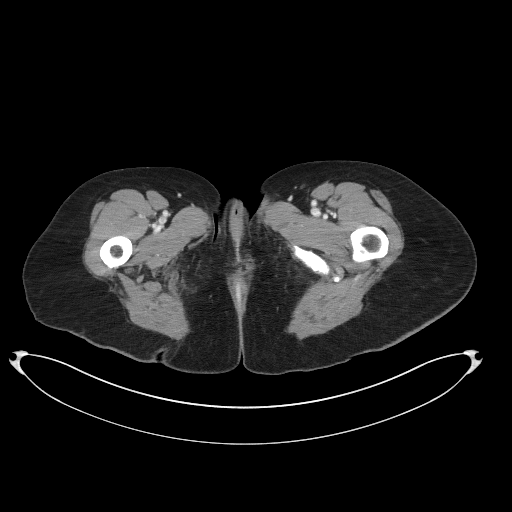
[im 5/84  bone]
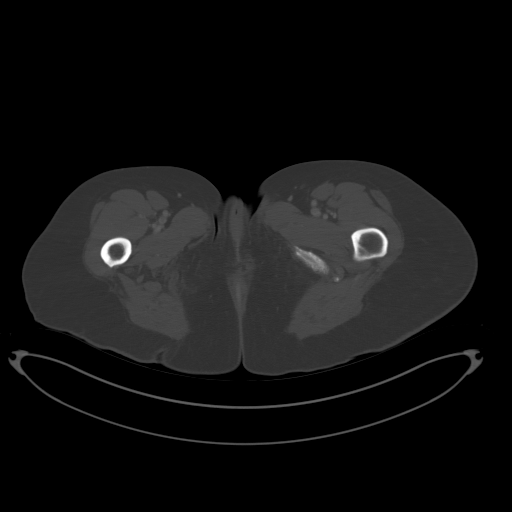
[im 13/84  soft-tissue]
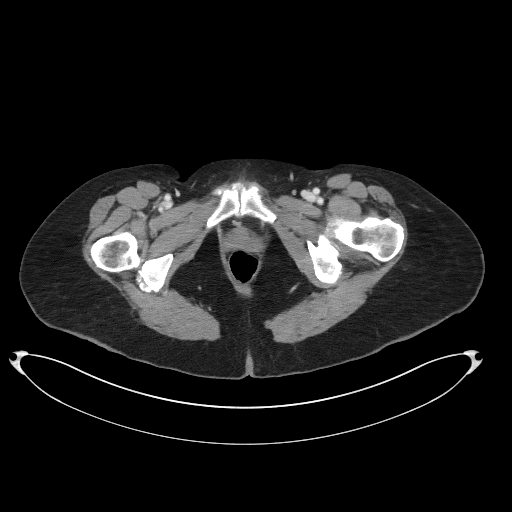
[im 17/84  soft-tissue]
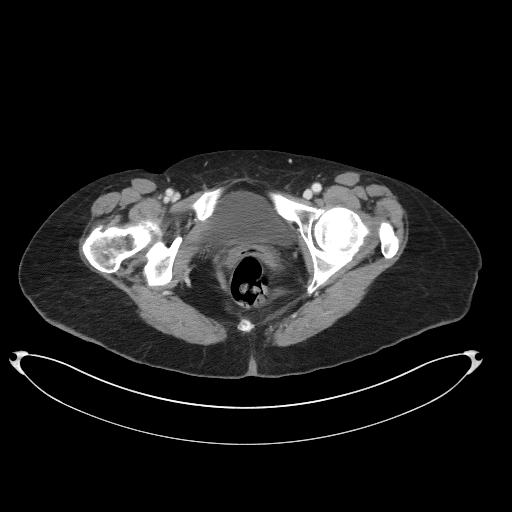
[im 21/84  soft-tissue]
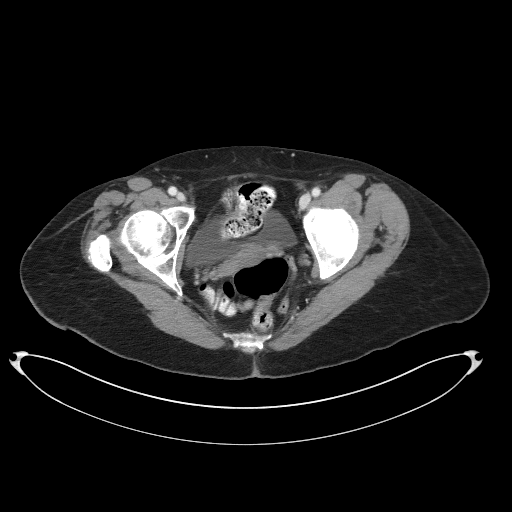
[im 30/84  soft-tissue]
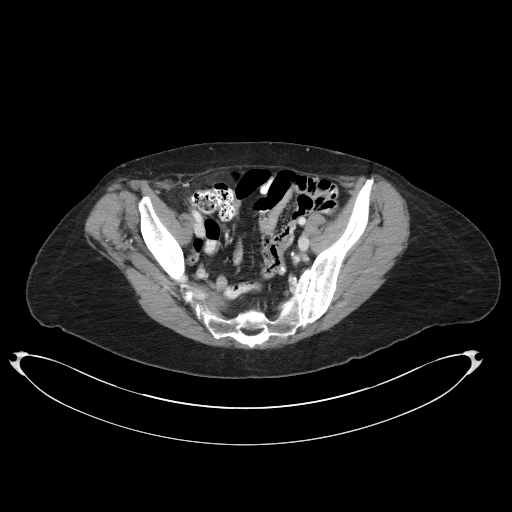
[im 34/84  soft-tissue]
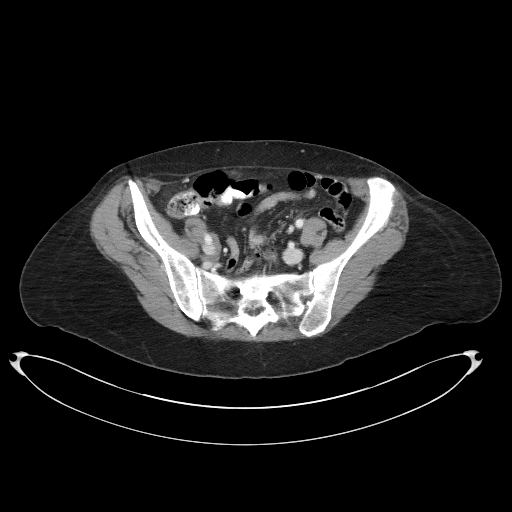
[im 38/84  soft-tissue]
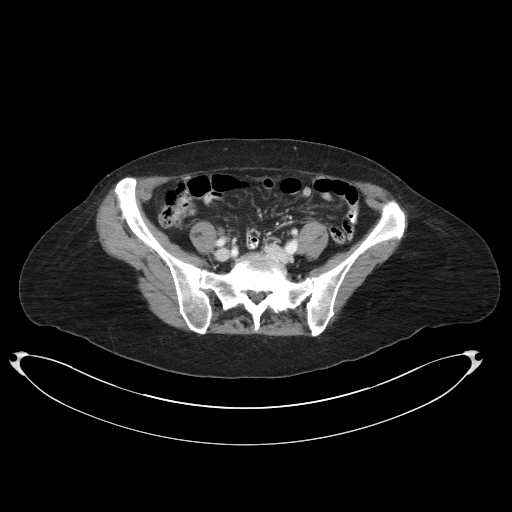
[im 46/84  soft-tissue]
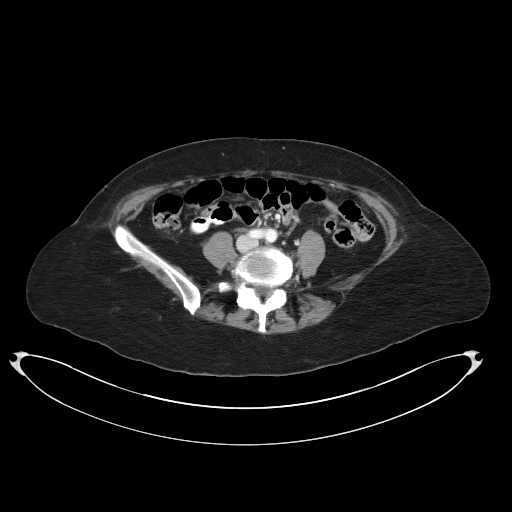
[im 50/84  soft-tissue]
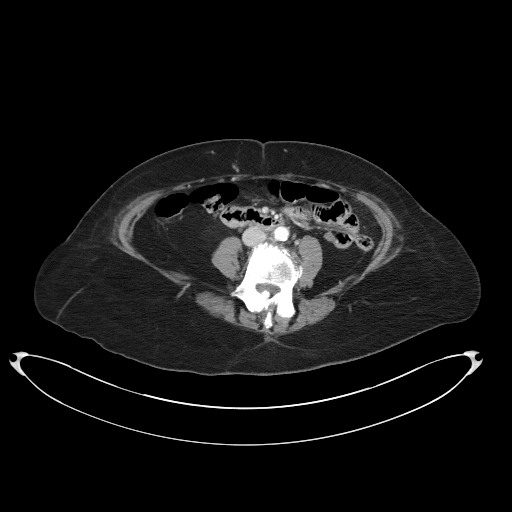
[im 50/84  bone]
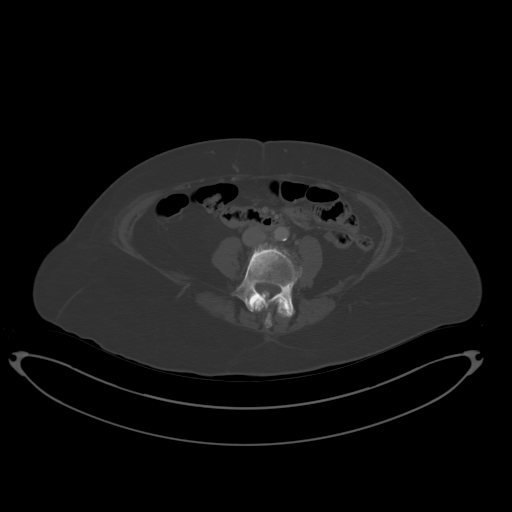
[im 54/84  soft-tissue]
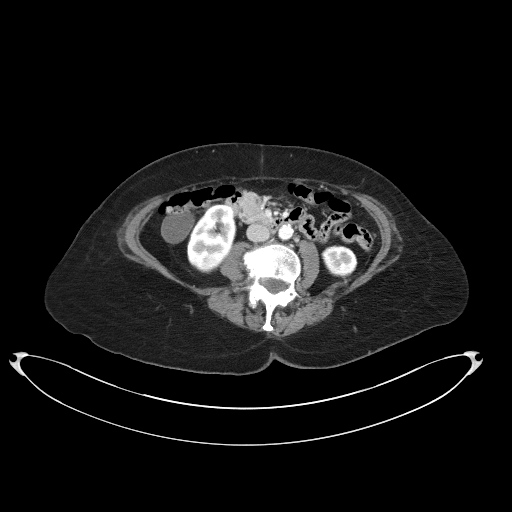
[im 63/84  soft-tissue]
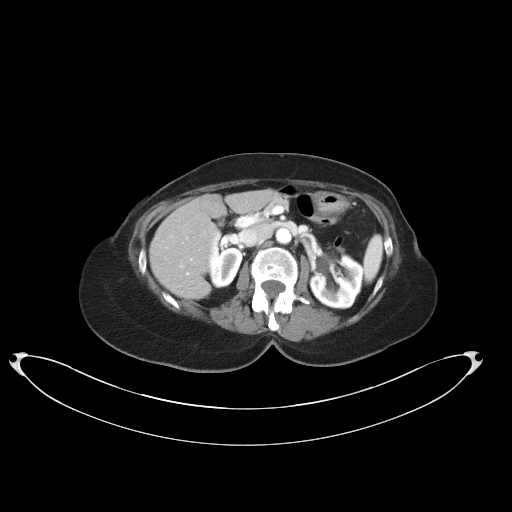
[im 67/84  soft-tissue]
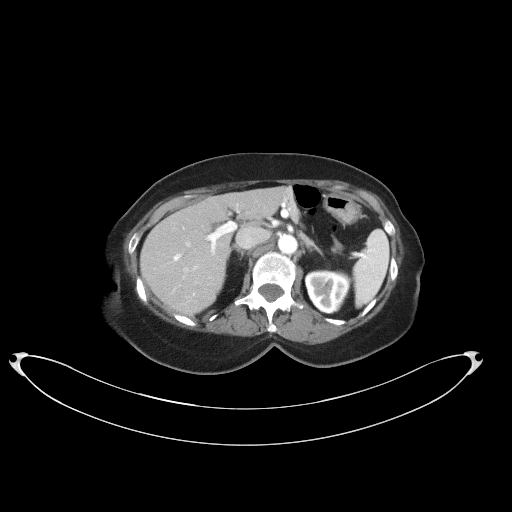
[im 71/84  soft-tissue]
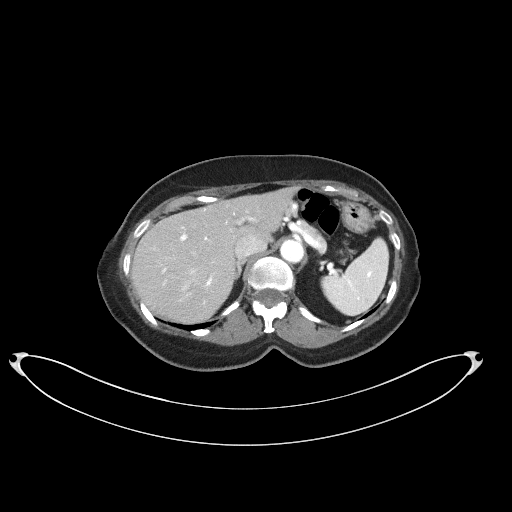
[im 79/84  soft-tissue]
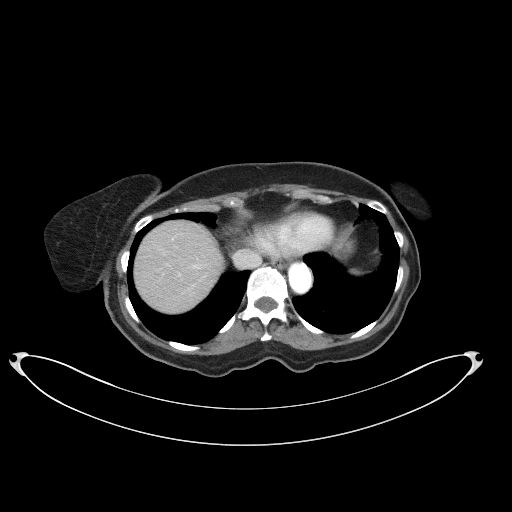

[Series 5: coronal st · coronal · 0.88mm/px · 3 of 76 slices shown]
[im 26/76  soft-tissue]
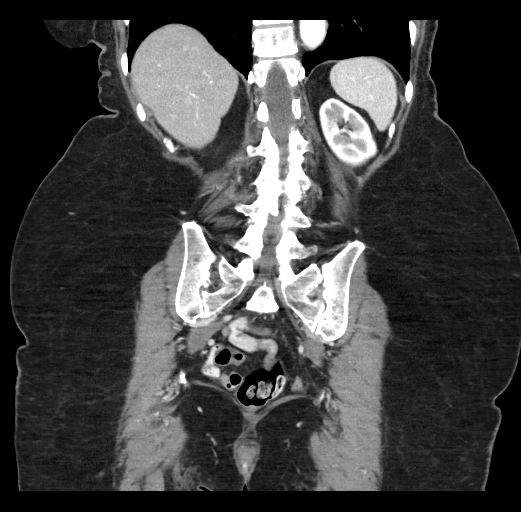
[im 34/76  soft-tissue]
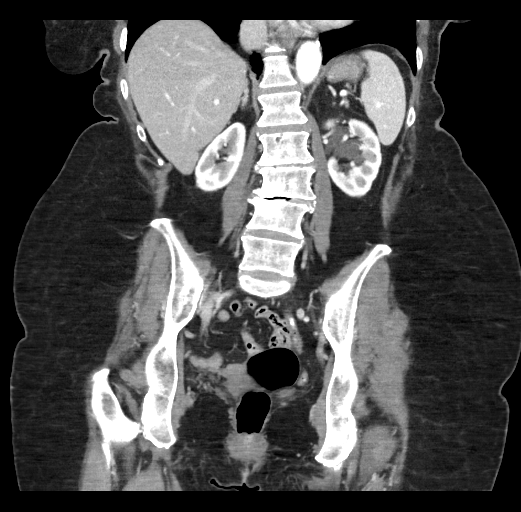
[im 42/76  soft-tissue]
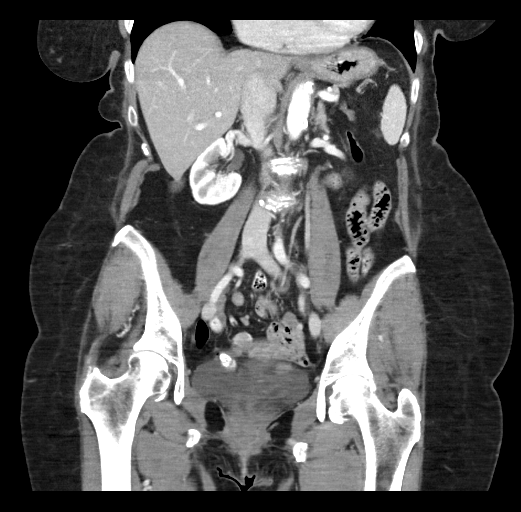

[17 of 46 positions shown; findings below may reference images not displayed]

FINDINGS: Lower chest: No acute abnormality.

Hepatobiliary: No solid liver abnormality is seen. No gallstones,
gallbladder wall thickening, or biliary dilatation.

Pancreas: Unremarkable. No pancreatic ductal dilatation or
surrounding inflammatory changes.

Spleen: Normal in size without significant abnormality.

Adrenals/Urinary Tract: Adrenal glands are unremarkable. Kidneys are
normal, without renal calculi, solid lesion, or hydronephrosis.
Left-sided parapelvic cysts. Bladder is unremarkable.

Stomach/Bowel: Stomach is within normal limits. Appendix is not
clearly visualized and may be surgically absent. No evidence of
bowel wall thickening, distention, or inflammatory changes.

Vascular/Lymphatic: Aortic atherosclerosis. No enlarged abdominal or
pelvic lymph nodes.

Reproductive: No mass or other significant abnormality.

Other: No abdominal wall hernia or abnormality. No abdominopelvic
ascites.

Musculoskeletal: No acute or significant osseous findings.
IMPRESSION: 1. No acute CT findings of the abdomen or pelvis to explain
abdominal pain or weight loss.
2. No evidence of mass or lymphadenopathy in the abdomen or pelvis.

Aortic Atherosclerosis ([id number]).

## 2023-08-03 ENCOUNTER — Other Ambulatory Visit: Payer: Self-pay

## 2023-08-03 ENCOUNTER — Encounter (HOSPITAL_COMMUNITY): Payer: Self-pay | Admitting: Emergency Medicine

## 2023-08-03 ENCOUNTER — Emergency Department (HOSPITAL_COMMUNITY)
Admission: EM | Admit: 2023-08-03 | Discharge: 2023-08-04 | Disposition: A | Discharge status: Home / Self Care | Payer: Medicare Other | Attending: Emergency Medicine | Admitting: Emergency Medicine

## 2023-08-03 DIAGNOSIS — I10 Essential (primary) hypertension: Secondary | ICD-10-CM | POA: Diagnosis not present

## 2023-08-03 DIAGNOSIS — R1031 Right lower quadrant pain: Secondary | ICD-10-CM | POA: Diagnosis present

## 2023-08-03 DIAGNOSIS — F0390 Unspecified dementia without behavioral disturbance: Secondary | ICD-10-CM | POA: Diagnosis not present

## 2023-08-03 DIAGNOSIS — Z79899 Other long term (current) drug therapy: Secondary | ICD-10-CM | POA: Diagnosis not present

## 2023-08-03 NOTE — ED Triage Notes (Signed)
Patient coming to ED for evaluation of lower abdominal pain.  Daughter reports that patient lives at Assisted Living.  Family was called today due to patient "not acting like herself."  When daughter went to visit patient she noticed patient was holding L lower abdominal.  Mass like density felt.  No reports of nausea or vomiting.

## 2023-08-04 ENCOUNTER — Emergency Department (HOSPITAL_COMMUNITY): Payer: Medicare Other

## 2023-08-04 ENCOUNTER — Encounter (HOSPITAL_COMMUNITY): Payer: Self-pay

## 2023-08-04 DIAGNOSIS — R1031 Right lower quadrant pain: Secondary | ICD-10-CM | POA: Diagnosis not present

## 2023-08-04 LAB — URINALYSIS, ROUTINE W REFLEX MICROSCOPIC
Bilirubin Urine: NEGATIVE
Glucose, UA: NEGATIVE mg/dL
Ketones, ur: NEGATIVE mg/dL
Nitrite: NEGATIVE
Protein, ur: 30 mg/dL — AB
Specific Gravity, Urine: 1.028 (ref 1.005–1.030)
pH: 5 (ref 5.0–8.0)

## 2023-08-04 LAB — COMPREHENSIVE METABOLIC PANEL
ALT: 21 U/L (ref 0–44)
AST: 31 U/L (ref 15–41)
Albumin: 4.2 g/dL (ref 3.5–5.0)
Alkaline Phosphatase: 68 U/L (ref 38–126)
Anion gap: 10 (ref 5–15)
BUN: 24 mg/dL — ABNORMAL HIGH (ref 8–23)
CO2: 26 mmol/L (ref 22–32)
Calcium: 9.1 mg/dL (ref 8.9–10.3)
Chloride: 99 mmol/L (ref 98–111)
Creatinine, Ser: 0.98 mg/dL (ref 0.44–1.00)
GFR, Estimated: 57 mL/min — ABNORMAL LOW (ref >60)
Glucose, Bld: 116 mg/dL — ABNORMAL HIGH (ref 70–99)
Potassium: 3.9 mmol/L (ref 3.5–5.1)
Sodium: 135 mmol/L (ref 135–145)
Total Bilirubin: 0.8 mg/dL (ref 0.3–1.2)
Total Protein: 7.2 g/dL (ref 6.5–8.1)

## 2023-08-04 LAB — CBC
HCT: 44.7 % (ref 36.0–46.0)
Hemoglobin: 14.3 g/dL (ref 12.0–15.0)
MCH: 27.3 pg (ref 26.0–34.0)
MCHC: 32 g/dL (ref 30.0–36.0)
MCV: 85.3 fL (ref 80.0–100.0)
Platelets: 180 10*3/uL (ref 150–400)
RBC: 5.24 MIL/uL — ABNORMAL HIGH (ref 3.87–5.11)
RDW: 14.2 % (ref 11.5–15.5)
WBC: 7.3 10*3/uL (ref 4.0–10.5)
nRBC: 0 % (ref 0.0–0.2)

## 2023-08-04 LAB — LIPASE, BLOOD: Lipase: 43 U/L (ref 11–51)

## 2023-08-04 MED ORDER — IOHEXOL 300 MG/ML  SOLN
80.0000 mL | Freq: Once | INTRAMUSCULAR | Status: AC | PRN
  Administered 2023-08-04: 75 mL via INTRAVENOUS

## 2023-08-04 MED ORDER — SODIUM CHLORIDE (PF) 0.9 % IJ SOLN
INTRAMUSCULAR | Status: AC
  Filled 2023-08-04: qty 50

## 2023-08-04 NOTE — Discharge Instructions (Signed)
Your testing is reassuring.  Your CT scan is normal without evidence of hernia or mass.  You will be called if your urine culture becomes positive and you need an antibiotic.  Follow-up with your primary doctor.  Return to the ED with new or worsening symptoms.

## 2023-08-04 NOTE — ED Provider Notes (Signed)
[facility] EMERGENCY DEPARTMENT AT [facility] Provider Note   CSN: [id number] Arrival date & time: 08/03/23  2008     History  Chief Complaint  Patient presents with   Abdominal Pain    Susan Mayer is a 82 y.o. female.  Patient with a history of dementia brought in by daughter from assisted living facility.  Facility became concerned tonight because patient was not wanting to eat dinner which is unusual for her.  She is complaining of right-sided lower abdominal pain.  Daughter noticed patient was holding her right lower abdomen and thought she felt some kind of mass in that area.  No no nausea or vomiting.  No known diarrhea or constipation.  No pain with urination or blood in the urine but has had UTIs in the past.  Was more confused earlier but seems more lucid now.  No previous abdominal surgeries.  No history of a hernia.  Takes medicine for hypertension and dementia but no other medical conditions.  The history is provided by the patient and a relative.  Abdominal Pain Associated symptoms: no chest pain, no cough, no diarrhea, no dysuria, no hematuria, no nausea, no shortness of breath and no vomiting        Home Medications Prior to Admission medications   Medication Sig Start Date End Date Taking? Authorizing Provider  dicyclomine (BENTYL) 20 MG tablet Take 1 tablet (20 mg total) by mouth 2 (two) times daily. 02/14/21   Gailen Shelter, PA  dicyclomine (BENTYL) 20 MG tablet TAKE 1 TABLET BY MOUTH TWICE DAILY 02/14/21 02/14/22  Gailen Shelter, PA  levothyroxine (SYNTHROID) 50 MCG tablet TAKE 1 TABLET BY MOUTH EVERY DAY AT 6 AM 11/30/15   [provider]  losartan (COZAAR) 25 MG tablet TK 1 T PO QD 12/08/15   [provider]  omeprazole (PRILOSEC) 40 MG capsule Take 1 capsule by mouth daily. 04/22/18   [provider]  pravastatin (PRAVACHOL) 20 MG tablet Take 1 tablet by mouth daily. 11/30/15   [provider]  sucralfate  (CARAFATE) 1 g tablet Take 1 tablet (1 g total) by mouth 4 (four) times daily -  with meals and at bedtime. 02/14/21   Gailen Shelter, PA  sucralfate (CARAFATE) 1 g tablet TAKE 1 TABLET BY MOUTH 4 TIMES DAILY WITH MEALS AND AT BEDTIME 02/14/21 02/14/22  Gailen Shelter, PA      Allergies    Penicillins and Sulfa antibiotics    Review of Systems   Review of Systems  Constitutional:  Positive for activity change and appetite change.  HENT:  Negative for congestion and rhinorrhea.   Respiratory:  Negative for cough, chest tightness and shortness of breath.   Cardiovascular:  Negative for chest pain.  Gastrointestinal:  Positive for abdominal pain. Negative for diarrhea, nausea and vomiting.  Genitourinary:  Negative for dysuria and hematuria.  Musculoskeletal:  Negative for arthralgias and myalgias.  Skin:  Negative for rash.  Neurological:  Negative for dizziness, weakness, light-headedness and headaches.   all other systems are negative except as noted in the HPI and PMH.    Physical Exam Updated Vital Signs BP (!) 145/102   Pulse 96   Temp 99.3 F (37.4 C)   Resp 18   Ht 5\' 4"  (1.626 m)   Wt 56.7 kg   SpO2 99%   BMI 21.46 kg/m  Physical Exam Vitals and nursing note reviewed.  Constitutional:      General: She is not  in acute distress.    Appearance: She is well-developed.     Comments: Oriented to person only  HENT:     Head: Normocephalic and atraumatic.     Mouth/Throat:     Pharynx: No oropharyngeal exudate.  Eyes:     Conjunctiva/sclera: Conjunctivae normal.     Pupils: Pupils are equal, round, and reactive to light.  Neck:     Comments: No meningismus. Cardiovascular:     Rate and Rhythm: Normal rate and regular rhythm.     Heart sounds: Normal heart sounds. No murmur heard. Pulmonary:     Effort: Pulmonary effort is normal. No respiratory distress.     Breath sounds: Normal breath sounds.  Abdominal:     Palpations: Abdomen is soft.     Tenderness: There  is abdominal tenderness. There is no guarding or rebound.     Comments: Soft abdomen, tender in the right side, no appreciable mass felt  Musculoskeletal:        General: No tenderness. Normal range of motion.     Cervical back: Normal range of motion and neck supple.  Skin:    General: Skin is warm.  Neurological:     Mental Status: She is alert and oriented to person, place, and time.     Cranial Nerves: No cranial nerve deficit.     Motor: No abnormal muscle tone.     Coordination: Coordination normal.     Comments:  5/5 strength throughout. CN 2-12 intact.Equal grip strength.   Psychiatric:        Behavior: Behavior normal.     ED Results / Procedures / Treatments   Labs (all labs ordered are listed, but only abnormal results are displayed) Labs Reviewed  COMPREHENSIVE METABOLIC PANEL - Abnormal; Notable for the following components:      Result Value   Glucose, Bld 116 (*)    BUN 24 (*)    GFR, Estimated 57 (*)    All other components within normal limits  CBC - Abnormal; Notable for the following components:   RBC 5.24 (*)    All other components within normal limits  URINALYSIS, ROUTINE W REFLEX MICROSCOPIC - Abnormal; Notable for the following components:   APPearance HAZY (*)    Hgb urine dipstick SMALL (*)    Protein, ur 30 (*)    Leukocytes,Ua MODERATE (*)    Bacteria, UA RARE (*)    All other components within normal limits  URINE CULTURE  LIPASE, BLOOD    EKG None  Radiology CT ABDOMEN PELVIS W CONTRAST  Result Date: 08/04/2023 CLINICAL DATA:  Abdominal pain. Acute. Query right lower quadrant mass. EXAM: CT ABDOMEN AND PELVIS WITH CONTRAST TECHNIQUE: Multidetector CT imaging of the abdomen and pelvis was performed using the standard protocol following bolus administration of intravenous contrast. RADIATION DOSE REDUCTION: This exam was performed according to the departmental dose-optimization program which includes automated exposure control, adjustment of  the mA and/or kV according to patient size and/or use of iterative reconstruction technique. CONTRAST:  75mL OMNIPAQUE IOHEXOL 300 MG/ML  SOLN COMPARISON:  02/14/2021 FINDINGS: Lower chest: No acute abnormality. Hepatobiliary: No focal liver abnormality is seen. No gallstones, gallbladder wall thickening, or biliary dilatation. Pancreas: Unremarkable. No pancreatic ductal dilatation or surrounding inflammatory changes. Spleen: Normal in size without focal abnormality. Adrenals/Urinary Tract: Normal appearance of the adrenal glands. No nephrolithiasis, suspicious mass, or hydronephrosis. Left kidney parapelvic cyst is identified measuring 4 cm. No follow-up imaging recommended. Urinary bladder is unremarkable. Stomach/Bowel: Stomach  is nondistended. The appendix is not visualized and may be surgically absent. No pathologic dilatation of the large or small bowel loops. No bowel wall thickening or inflammation identified. Vascular/Lymphatic: Aortic atherosclerosis without aneurysm. No signs of abdominopelvic adenopathy. Reproductive: Uterus and adnexal structures are unremarkable. Other: No ascites or focal fluid collection. No signs of pneumoperitoneum. Musculoskeletal: Lumbar curvature is convex towards the left. First degree anterolisthesis L3 on L4. Multilevel degenerative disc disease. IMPRESSION: 1. No acute findings within the abdomen or pelvis. 2. No signs of right lower quadrant mass. 3.  Aortic Atherosclerosis (ICD10-I70.0). Electronically Signed   By: Signa Kell M.D.   On: 08/04/2023 05:43    Procedures Procedures    Medications Ordered in ED Medications - No data to display  ED Course/ Medical Decision Making/ A&P                                 Medical Decision Making Amount and/or Complexity of Data Reviewed Labs: ordered. Decision-making details documented in ED Course. Radiology: ordered and independent interpretation performed. Decision-making details documented in ED  Course. ECG/medicine tests: ordered and independent interpretation performed. Decision-making details documented in ED Course.  Risk Prescription drug management.   Lower abdominal pain with questionable mass.  Vital stable, no distress.  Abdomen soft with no obvious mass currently.  No hernia seen  Lab work is reassuring.  Urinalysis with pyuria.  She denies UTI symptoms.  Will send for culture.  CT scan obtained given report of lower abdominal pain with possible "mass".  CT is negative for acute pathology.  No mass or hernia seen.  On recheck, patient feels improved, tolerating p.o.  Denies abdominal pain.  Abdomen is soft and nontender.  She appears stable to discharge back to her facility.  Discussed we will send urine culture and call if it is positive.  Return precautions discussed.        Final Clinical Impression(s) / ED Diagnoses Final diagnoses:  Right lower quadrant abdominal pain    Rx / DC Orders ED Discharge Orders     None         Jaymes Hang, Jeannett Senior, MD 08/04/23 [phone]

## 2023-08-05 LAB — URINE CULTURE: Culture: NO GROWTH

## 2024-06-14 ENCOUNTER — Emergency Department (HOSPITAL_COMMUNITY)
Admission: EM | Admit: 2024-06-14 | Discharge: 2024-06-14 | Disposition: A | Discharge status: Home / Self Care | Attending: Emergency Medicine | Admitting: Emergency Medicine

## 2024-06-14 ENCOUNTER — Emergency Department (HOSPITAL_COMMUNITY)

## 2024-06-14 ENCOUNTER — Other Ambulatory Visit: Payer: Self-pay

## 2024-06-14 DIAGNOSIS — W19XXXA Unspecified fall, initial encounter: Secondary | ICD-10-CM | POA: Insufficient documentation

## 2024-06-14 DIAGNOSIS — S7002XA Contusion of left hip, initial encounter: Secondary | ICD-10-CM | POA: Insufficient documentation

## 2024-06-14 DIAGNOSIS — M25552 Pain in left hip: Secondary | ICD-10-CM | POA: Diagnosis present

## 2024-06-14 DIAGNOSIS — F0390 Unspecified dementia without behavioral disturbance: Secondary | ICD-10-CM | POA: Diagnosis not present

## 2024-06-14 DIAGNOSIS — Z79899 Other long term (current) drug therapy: Secondary | ICD-10-CM | POA: Insufficient documentation

## 2024-06-14 DIAGNOSIS — I10 Essential (primary) hypertension: Secondary | ICD-10-CM | POA: Insufficient documentation

## 2024-06-14 MED ORDER — ACETAMINOPHEN 500 MG PO TABS
1000.0000 mg | ORAL_TABLET | Freq: Once | ORAL | Status: AC
  Administered 2024-06-14: 1000 mg via ORAL
  Filled 2024-06-14: qty 2

## 2024-06-14 NOTE — ED Triage Notes (Addendum)
 Pt BIB by EMS from [facility]. EMS reported an unwitnessed fall, unknown LOC, no obvious deformity. Not on blood thinners. Per EMS pt is reporting left hip pain. Hx of dementia, A&Ox1 at baseline.

## 2024-06-14 NOTE — ED Provider Notes (Signed)
 [place] EMERGENCY DEPARTMENT AT [facility] Provider Note   CSN: [id number] Arrival date & time: 06/14/24  [id number]     Patient presents with: Susan Mayer is a 83 y.o. female.   Patient is an 83 year old female with a history of hypertension, hypercholesterolemia, dementia who lives at [facility] in the memory unit who is presenting today after an unwitnessed fall.  Her daughter is here at bedside as well.  She reports that she saw her yesterday and she seemed to be doing well.  There have been no recent issues in the last few days.  However they went to check on her this morning and she was lying on the floor.  Patient's only complaint is of pain in her left hip.  She otherwise does have a difficult time communicating but this states there is no pain anywhere else.  She does not take any anticoagulation.  Daughter reports that she is at her baseline currently.  The history is provided by the nursing home, a relative and medical records.  Fall       Prior to Admission medications   Medication Sig Start Date End Date Taking? Authorizing Provider  dicyclomine  (BENTYL ) 20 MG tablet Take 1 tablet (20 mg total) by mouth 2 (two) times daily. 02/14/21   Neldon Hamp RAMAN, PA  dicyclomine  (BENTYL ) 20 MG tablet TAKE 1 TABLET BY MOUTH TWICE DAILY 02/14/21 02/14/22  Neldon Hamp RAMAN, PA  levothyroxine (SYNTHROID) 50 MCG tablet TAKE 1 TABLET BY MOUTH EVERY DAY AT 6 AM 11/30/15   [provider]  losartan (COZAAR) 25 MG tablet TK 1 T PO QD 12/08/15   [provider]  omeprazole (PRILOSEC) 40 MG capsule Take 1 capsule by mouth daily. 04/22/18   [provider]  pravastatin (PRAVACHOL) 20 MG tablet Take 1 tablet by mouth daily. 11/30/15   [provider]  sucralfate  (CARAFATE ) 1 g tablet Take 1 tablet (1 g total) by mouth 4 (four) times daily -  with meals and at bedtime. 02/14/21   Neldon Hamp RAMAN, PA  sucralfate  (CARAFATE ) 1 g tablet TAKE 1 TABLET BY  MOUTH 4 TIMES DAILY WITH MEALS AND AT BEDTIME 02/14/21 02/14/22  Neldon Hamp RAMAN, PA    Allergies: Penicillins and Sulfa antibiotics    Review of Systems  Updated Vital Signs BP (!) 144/70   Pulse 68   Temp 97.8 F (36.6 C) (Oral)   Resp 12   SpO2 98%   Physical Exam Vitals and nursing note reviewed.  Constitutional:      General: She is not in acute distress.    Appearance: She is well-developed.  HENT:     Head: Normocephalic and atraumatic.  Eyes:     Pupils: Pupils are equal, round, and reactive to light.  Cardiovascular:     Rate and Rhythm: Normal rate and regular rhythm.     Heart sounds: Normal heart sounds. No murmur heard.    No friction rub.  Pulmonary:     Effort: Pulmonary effort is normal.     Breath sounds: Normal breath sounds. No wheezing or rales.  Abdominal:     General: Bowel sounds are normal. There is no distension.     Palpations: Abdomen is soft.     Tenderness: There is no abdominal tenderness. There is no guarding or rebound.  Musculoskeletal:        General: Tenderness present.     Right shoulder: Normal.     Left shoulder:  Normal.     Cervical back: Normal, normal range of motion and neck supple. No tenderness.     Thoracic back: Normal.     Lumbar back: Normal.     Right hip: Normal.     Left hip: Tenderness and bony tenderness present. Normal range of motion.     Right knee: Normal.     Left knee: Normal.     Right lower leg: No edema.     Left lower leg: No edema.     Right ankle: Normal.     Left ankle: Normal.     Comments: No edema  Skin:    General: Skin is warm and dry.     Findings: No rash.  Neurological:     Mental Status: She is alert. Mental status is at baseline.     Cranial Nerves: No cranial nerve deficit.     Comments: Oriented to person  Psychiatric:        Behavior: Behavior normal.     (all labs ordered are listed, but only abnormal results are displayed) Labs Reviewed - No data to display  EKG: EKG  Interpretation Date/Time:  Wednesday June 14 2024 06:48:45 EDT Ventricular Rate:  61 PR Interval:  182 QRS Duration:  87 QT Interval:  466 QTC Calculation: 470 R Axis:   -61  Text Interpretation: Sinus rhythm Left anterior fascicular block Low voltage, precordial leads Abnormal R-wave progression, early transition Consider anterior infarct No previous tracing Confirmed by Doretha Folks ([id number]) on 06/14/2024 7:27:47 AM  Radiology: [facility] Hip Unilat W or Wo Pelvis 2-3 Views Left Result Date: 06/14/2024 CLINICAL DATA:  pain after fall EXAM: DG HIP (WITH OR WITHOUT PELVIS) 2-3V LEFT COMPARISON:  None Available. FINDINGS: No evidence of pelvic fracture or diastasis.No acute hip fracture or dislocation.Multilevel degenerative disc disease of the lumbar spine.Soft tissues are unremarkable. IMPRESSION: No acute fracture, pelvic bone diastasis, or dislocation. Electronically Signed   By: Rogelia Myers M.D.   On: 06/14/2024 08:46     Procedures   Medications Ordered in the ED  acetaminophen  (TYLENOL ) tablet 1,000 mg (1,000 mg Oral Given 06/14/24 0729)                                    Medical Decision Making Amount and/or Complexity of Data Reviewed Radiology: ordered and independent interpretation performed. Decision-making details documented in ED Course.  Risk OTC drugs.   Pt with multiple medical problems and comorbidities and presenting today with a complaint that caries a high risk for morbidity and mortality.  Here today after an unwitnessed fall.  Patient has no evidence of trauma to her head and does not take any anticoagulation.  She has full range of motion of her neck.  She is able to move all extremities has no unilateral weakness but does have pain in her left hip.  Concern for possible hip fracture versus strain versus pelvic fracture.  Lower suspicion for infectious etiology.  Vital signs are reassuring.  I have independently visualized and interpreted pt's images today.  Hip  imaging is negative for acute fracture.  Will ensure patient is able to walk prior to discharge.  Otherwise remains at her baseline.\  9:33 AM Patient was able to ambulate here and bear weight.  Still complaining of some pain in her leg but most likely bruised from her fall.  At this time patient is stable and ready to go  home.  Patient's daughter is at bedside and agrees with this plan.       Final diagnoses:  Fall, initial encounter  Contusion of left hip, initial encounter    ED Discharge Orders     None          Doretha Folks, MD 06/14/24 [id number]

## 2024-06-14 NOTE — Discharge Instructions (Addendum)
 X-ray did not show any sign of fracture.  However do feel that her hip is bruised.  She can have extra strength Tylenol  500 mg every 4 hours as needed for pain

## 2024-06-14 NOTE — ED Notes (Signed)
 Pt stating that she needs to use the bathroom, placed pt on the bedpan with the tech. She was unable to comprehend being on the bedpan. A brief was placed on the pt.

## 2024-10-31 ENCOUNTER — Emergency Department (HOSPITAL_COMMUNITY)

## 2024-10-31 ENCOUNTER — Other Ambulatory Visit: Payer: Self-pay

## 2024-10-31 ENCOUNTER — Inpatient Hospital Stay (HOSPITAL_COMMUNITY)
Admission: EM | Admit: 2024-10-31 | Discharge: 2024-11-03 | DRG: 872 | Disposition: A | Discharge status: Skilled Nursing Facility | Source: Skilled Nursing Facility | Attending: Internal Medicine | Admitting: Internal Medicine

## 2024-10-31 DIAGNOSIS — Z880 Allergy status to penicillin: Secondary | ICD-10-CM

## 2024-10-31 DIAGNOSIS — N390 Urinary tract infection, site not specified: Principal | ICD-10-CM | POA: Diagnosis present

## 2024-10-31 DIAGNOSIS — F0390 Unspecified dementia without behavioral disturbance: Secondary | ICD-10-CM | POA: Diagnosis not present

## 2024-10-31 DIAGNOSIS — Z79899 Other long term (current) drug therapy: Secondary | ICD-10-CM

## 2024-10-31 DIAGNOSIS — Z882 Allergy status to sulfonamides status: Secondary | ICD-10-CM

## 2024-10-31 DIAGNOSIS — I10 Essential (primary) hypertension: Secondary | ICD-10-CM | POA: Diagnosis present

## 2024-10-31 DIAGNOSIS — E039 Hypothyroidism, unspecified: Secondary | ICD-10-CM | POA: Diagnosis not present

## 2024-10-31 DIAGNOSIS — R6520 Severe sepsis without septic shock: Secondary | ICD-10-CM | POA: Diagnosis not present

## 2024-10-31 DIAGNOSIS — E7800 Pure hypercholesterolemia, unspecified: Secondary | ICD-10-CM | POA: Diagnosis present

## 2024-10-31 DIAGNOSIS — A419 Sepsis, unspecified organism: Secondary | ICD-10-CM | POA: Diagnosis not present

## 2024-10-31 DIAGNOSIS — N179 Acute kidney failure, unspecified: Secondary | ICD-10-CM | POA: Diagnosis not present

## 2024-10-31 DIAGNOSIS — K219 Gastro-esophageal reflux disease without esophagitis: Secondary | ICD-10-CM | POA: Diagnosis not present

## 2024-10-31 DIAGNOSIS — N3090 Cystitis, unspecified without hematuria: Secondary | ICD-10-CM

## 2024-10-31 DIAGNOSIS — Z1152 Encounter for screening for COVID-19: Secondary | ICD-10-CM

## 2024-10-31 DIAGNOSIS — Z79890 Hormone replacement therapy (postmenopausal): Secondary | ICD-10-CM

## 2024-10-31 LAB — CBC WITH DIFFERENTIAL/PLATELET
Abs Immature Granulocytes: 0.14 K/uL — ABNORMAL HIGH (ref 0.00–0.07)
Basophils Absolute: 0 K/uL (ref 0.0–0.1)
Basophils Relative: 0 %
Eosinophils Absolute: 0 K/uL (ref 0.0–0.5)
Eosinophils Relative: 0 %
HCT: 43.4 % (ref 36.0–46.0)
Hemoglobin: 13.9 g/dL (ref 12.0–15.0)
Immature Granulocytes: 1 %
Lymphocytes Relative: 5 %
Lymphs Abs: 0.9 K/uL (ref 0.7–4.0)
MCH: 26.9 pg (ref 26.0–34.0)
MCHC: 32 g/dL (ref 30.0–36.0)
MCV: 83.9 fL (ref 80.0–100.0)
Monocytes Absolute: 1.4 K/uL — ABNORMAL HIGH (ref 0.1–1.0)
Monocytes Relative: 7 %
Neutro Abs: 16.7 K/uL — ABNORMAL HIGH (ref 1.7–7.7)
Neutrophils Relative %: 87 %
Platelets: 191 K/uL (ref 150–400)
RBC: 5.17 MIL/uL — ABNORMAL HIGH (ref 3.87–5.11)
RDW: 14.9 % (ref 11.5–15.5)
WBC: 19.1 K/uL — ABNORMAL HIGH (ref 4.0–10.5)
nRBC: 0 % (ref 0.0–0.2)

## 2024-10-31 LAB — URINALYSIS, ROUTINE W REFLEX MICROSCOPIC
Bilirubin Urine: NEGATIVE
Glucose, UA: NEGATIVE mg/dL
Ketones, ur: NEGATIVE mg/dL
Nitrite: NEGATIVE
Protein, ur: 100 mg/dL — AB
RBC / HPF: >50 RBC/hpf (ref 0–5)
Specific Gravity, Urine: 1.02 (ref 1.005–1.030)
WBC, UA: >50 WBC/hpf (ref 0–5)
pH: 5 (ref 5.0–8.0)

## 2024-10-31 LAB — RESP PANEL BY RT-PCR (RSV, FLU A&B, COVID)  RVPGX2
Influenza A by PCR: NEGATIVE
Influenza B by PCR: NEGATIVE
Resp Syncytial Virus by PCR: NEGATIVE
SARS Coronavirus 2 by RT PCR: NEGATIVE

## 2024-10-31 LAB — COMPREHENSIVE METABOLIC PANEL WITH GFR
ALT: 25 U/L (ref 0–44)
AST: 40 U/L (ref 15–41)
Albumin: 3.6 g/dL (ref 3.5–5.0)
Alkaline Phosphatase: 95 U/L (ref 38–126)
Anion gap: 12 (ref 5–15)
BUN: 26 mg/dL — ABNORMAL HIGH (ref 8–23)
CO2: 24 mmol/L (ref 22–32)
Calcium: 9.3 mg/dL (ref 8.9–10.3)
Chloride: 100 mmol/L (ref 98–111)
Creatinine, Ser: 1.44 mg/dL — ABNORMAL HIGH (ref 0.44–1.00)
GFR, Estimated: 36 mL/min — ABNORMAL LOW (ref >60)
Glucose, Bld: 121 mg/dL — ABNORMAL HIGH (ref 70–99)
Potassium: 3.9 mmol/L (ref 3.5–5.1)
Sodium: 136 mmol/L (ref 135–145)
Total Bilirubin: 0.9 mg/dL (ref 0.0–1.2)
Total Protein: 7.1 g/dL (ref 6.5–8.1)

## 2024-10-31 LAB — I-STAT CG4 LACTIC ACID, ED: Lactic Acid, Venous: 1.3 mmol/L (ref 0.5–1.9)

## 2024-10-31 LAB — MRSA NEXT GEN BY PCR, NASAL: MRSA by PCR Next Gen: NOT DETECTED

## 2024-10-31 LAB — MAGNESIUM: Magnesium: 2.2 mg/dL (ref 1.7–2.4)

## 2024-10-31 LAB — LIPASE, BLOOD: Lipase: 29 U/L (ref 11–51)

## 2024-10-31 MED ORDER — SODIUM CHLORIDE 0.9 % IV SOLN
500.0000 mg | Freq: Once | INTRAVENOUS | Status: AC
  Administered 2024-10-31: 500 mg via INTRAVENOUS
  Filled 2024-10-31: qty 5

## 2024-10-31 MED ORDER — ONDANSETRON HCL 4 MG/2ML IJ SOLN
4.0000 mg | Freq: Once | INTRAMUSCULAR | Status: AC
  Administered 2024-10-31: 4 mg via INTRAVENOUS
  Filled 2024-10-31: qty 2

## 2024-10-31 MED ORDER — SERTRALINE HCL 25 MG PO TABS
25.0000 mg | ORAL_TABLET | Freq: Every day | ORAL | Status: DC
  Administered 2024-11-01 – 2024-11-03 (×3): 25 mg via ORAL
  Filled 2024-10-31 (×3): qty 1

## 2024-10-31 MED ORDER — HEPARIN SODIUM (PORCINE) 5000 UNIT/ML IJ SOLN
5000.0000 [IU] | Freq: Three times a day (TID) | INTRAMUSCULAR | Status: DC
  Administered 2024-10-31 – 2024-11-03 (×9): 5000 [IU] via SUBCUTANEOUS
  Filled 2024-10-31 (×10): qty 1

## 2024-10-31 MED ORDER — BUSPIRONE HCL 5 MG PO TABS
15.0000 mg | ORAL_TABLET | Freq: Two times a day (BID) | ORAL | Status: DC
  Administered 2024-10-31 – 2024-11-03 (×5): 15 mg via ORAL
  Filled 2024-10-31 (×6): qty 1

## 2024-10-31 MED ORDER — SODIUM CHLORIDE 0.9 % IV BOLUS
1000.0000 mL | Freq: Once | INTRAVENOUS | Status: AC
  Administered 2024-10-31: 1000 mL via INTRAVENOUS

## 2024-10-31 MED ORDER — SODIUM CHLORIDE 0.9 % IV SOLN
2.0000 g | Freq: Once | INTRAVENOUS | Status: AC
  Administered 2024-10-31: 2 g via INTRAVENOUS
  Filled 2024-10-31: qty 20

## 2024-10-31 MED ORDER — ACETAMINOPHEN 650 MG RE SUPP: 650.0000 mg | Freq: Four times a day (QID) | RECTAL | Status: DC | PRN

## 2024-10-31 MED ORDER — ONDANSETRON HCL 4 MG PO TABS: 4.0000 mg | ORAL_TABLET | Freq: Four times a day (QID) | ORAL | Status: DC | PRN

## 2024-10-31 MED ORDER — TRAZODONE HCL 50 MG PO TABS: 25.0000 mg | ORAL_TABLET | Freq: Every evening | ORAL | Status: DC | PRN

## 2024-10-31 MED ORDER — LORATADINE 10 MG PO TABS
10.0000 mg | ORAL_TABLET | Freq: Every day | ORAL | Status: DC
  Administered 2024-10-31 – 2024-11-03 (×4): 10 mg via ORAL
  Filled 2024-10-31 (×4): qty 1

## 2024-10-31 MED ORDER — ONDANSETRON HCL 4 MG/2ML IJ SOLN: 4.0000 mg | Freq: Four times a day (QID) | INTRAMUSCULAR | Status: DC | PRN

## 2024-10-31 MED ORDER — LACTATED RINGERS IV SOLN: INTRAVENOUS | Status: AC

## 2024-10-31 MED ORDER — ATORVASTATIN CALCIUM 40 MG PO TABS
40.0000 mg | ORAL_TABLET | Freq: Every day | ORAL | Status: DC
  Administered 2024-10-31 – 2024-11-03 (×4): 40 mg via ORAL
  Filled 2024-10-31 (×4): qty 1

## 2024-10-31 MED ORDER — SODIUM CHLORIDE 0.9 % IV SOLN
1.0000 g | INTRAVENOUS | Status: DC
  Administered 2024-11-01 – 2024-11-03 (×3): 1 g via INTRAVENOUS
  Filled 2024-10-31 (×3): qty 10

## 2024-10-31 MED ORDER — ACETAMINOPHEN 650 MG RE SUPP
650.0000 mg | Freq: Once | RECTAL | Status: AC
  Administered 2024-10-31: 650 mg via RECTAL
  Filled 2024-10-31: qty 1

## 2024-10-31 MED ORDER — ALBUTEROL SULFATE (2.5 MG/3ML) 0.083% IN NEBU: 2.5000 mg | INHALATION_SOLUTION | RESPIRATORY_TRACT | Status: DC | PRN

## 2024-10-31 MED ORDER — ACETAMINOPHEN 325 MG PO TABS
650.0000 mg | ORAL_TABLET | Freq: Four times a day (QID) | ORAL | Status: DC | PRN
  Administered 2024-11-01: 650 mg via ORAL
  Filled 2024-10-31 (×2): qty 2

## 2024-10-31 MED ORDER — LEVOTHYROXINE SODIUM 50 MCG PO TABS
50.0000 ug | ORAL_TABLET | Freq: Every day | ORAL | Status: DC
  Administered 2024-11-01 – 2024-11-03 (×3): 50 ug via ORAL
  Filled 2024-10-31 (×3): qty 1

## 2024-10-31 MED ORDER — DONEPEZIL HCL 10 MG PO TABS
10.0000 mg | ORAL_TABLET | Freq: Every day | ORAL | Status: DC
  Administered 2024-10-31 – 2024-11-01 (×2): 10 mg via ORAL
  Filled 2024-10-31 (×3): qty 1

## 2024-10-31 MED ORDER — PANTOPRAZOLE SODIUM 40 MG PO TBEC
40.0000 mg | DELAYED_RELEASE_TABLET | Freq: Every day | ORAL | Status: DC
  Administered 2024-10-31 – 2024-11-03 (×4): 40 mg via ORAL
  Filled 2024-10-31 (×4): qty 1

## 2024-10-31 NOTE — H&P (Signed)
 History and Physical  Susan Mayer FMW:[id number] DOB: 1941-08-14 DOA: 10/31/2024  PCP: Patient, No Pcp Per   Chief Complaint: Lethargy, confusion  HPI: Susan Mayer is a 83 y.o. female with medical history significant for hypertension, hyperlipidemia and dementia resident of memory care unit admitted to the hospital with sepsis due to UTI.  History provided by the patient's daughter who is at the bedside, says that she and her brother to visit the patient quite frequently.  About 4 days ago, they were notified by the facility that the patient was more somnolent than usual, they went to visit her the next day and she was quite sleepy, not wanting to eat and not wanting to get out of bed.  She had no specific complaints, as far as daughter knows there was no fever, vomiting, or complaints of pain.  Workup in the ER consistent with sepsis due to UTI, patient was started on empiric IV antibiotics and hospitalist admission was requested.  Notably, daughter states that patient's baseline blood pressure is 90-100 systolic.  Review of Systems: Please see HPI for pertinent positives and negatives. A complete 10 system review of systems are otherwise negative.  Past Medical History:  Diagnosis Date   Hypercholesteremia    Hypertension    No past surgical history on file. Social History:  reports that she has never smoked. She has never used smokeless tobacco. She reports that she does not drink alcohol and does not use drugs.  Allergies  Allergen Reactions   Penicillins    Sulfa Antibiotics     No family history on file.   Prior to Admission medications   Medication Sig Start Date End Date Taking? Authorizing Provider  dicyclomine  (BENTYL ) 20 MG tablet Take 1 tablet (20 mg total) by mouth 2 (two) times daily. 02/14/21   Neldon Hamp RAMAN, PA  dicyclomine  (BENTYL ) 20 MG tablet TAKE 1 TABLET BY MOUTH TWICE DAILY 02/14/21 02/14/22  Neldon Hamp RAMAN, PA  levothyroxine  (SYNTHROID ) 50 MCG tablet TAKE 1  TABLET BY MOUTH EVERY DAY AT 6 AM 11/30/15   [provider]  losartan (COZAAR) 25 MG tablet TK 1 T PO QD 12/08/15   [provider]  omeprazole (PRILOSEC) 40 MG capsule Take 1 capsule by mouth daily. 04/22/18   [provider]  pravastatin (PRAVACHOL) 20 MG tablet Take 1 tablet by mouth daily. 11/30/15   [provider]  sucralfate  (CARAFATE ) 1 g tablet Take 1 tablet (1 g total) by mouth 4 (four) times daily -  with meals and at bedtime. 02/14/21   Neldon Hamp RAMAN, PA  sucralfate  (CARAFATE ) 1 g tablet TAKE 1 TABLET BY MOUTH 4 TIMES DAILY WITH MEALS AND AT BEDTIME 02/14/21 02/14/22  Neldon Hamp RAMAN, PA    Physical Exam: BP (!) 100/59   Pulse 71   Temp (!) 102 F (38.9 C) (Rectal)   Resp (!) 25   SpO2 96%  General: Alert, unable to determine orientation, moving all extremities, hard of hearing.  Her daughter is at the bedside.  Overall the patient appears dry. Cardiovascular: RRR, no murmurs or rubs, no peripheral edema  Respiratory: clear to auscultation bilaterally, no wheezes, no crackles  Abdomen: soft, nontender, nondistended, normal bowel tones heard  Skin: dry, no rashes  Musculoskeletal: no joint effusions, normal range of motion  Psychiatric: appropriate affect, normal speech  Neurologic: extraocular muscles intact, clear speech, moving all extremities with intact sensorium         Labs on Admission:  Basic Metabolic  Panel: Recent Labs  Lab 10/31/24 1133  NA 136  K 3.9  CL 100  CO2 24  GLUCOSE 121*  BUN 26*  CREATININE 1.44*  CALCIUM  9.3   Liver Function Tests: Recent Labs  Lab 10/31/24 1133  AST 40  ALT 25  ALKPHOS 95  BILITOT 0.9  PROT 7.1  ALBUMIN 3.6   Recent Labs  Lab 10/31/24 1133  LIPASE 29   No results for input(s): AMMONIA in the last 168 hours. CBC: Recent Labs  Lab 10/31/24 1133  WBC 19.1*  NEUTROABS 16.7*  HGB 13.9  HCT 43.4  MCV 83.9  PLT 191   Cardiac Enzymes: No results for input(s):  CKTOTAL, CKMB, CKMBINDEX, TROPONINI in the last 168 hours. BNP (last 3 results) No results for input(s): BNP in the last 8760 hours.  ProBNP (last 3 results) No results for input(s): PROBNP in the last 8760 hours.  CBG: No results for input(s): GLUCAP in the last 168 hours.  Radiological Exams on Admission: DG Chest Portable 1 View Result Date: 10/31/2024 EXAM: 1 VIEW(S) XRAY OF THE CHEST 10/31/2024 12:10:00 PM COMPARISON: None available. CLINICAL HISTORY: fever FINDINGS: LUNGS AND PLEURA: Patchy retrocardiac opacities. Possible small left pleural effusion. No pneumothorax. HEART AND MEDIASTINUM: Aortic atherosclerosis. No acute abnormality of the cardiac and mediastinal silhouettes. BONES AND SOFT TISSUES: No acute osseous abnormality. IMPRESSION: 1. Patchy retrocardiac opacities, atelectasis versus infection. 2. Possible small left pleural effusion. Electronically signed by: Donnice Mania MD 10/31/2024 12:51 PM EST RP Workstation: [id number]   Assessment/Plan Susan Mayer is a 83 y.o. female with medical history significant for hypertension, hyperlipidemia and dementia resident of memory care unit admitted to the hospital with sepsis due to UTI.   Severe sepsis-due to UTI, meeting criteria with fever, leukocytosis, hypotension, endorgan dysfunction with AKI.  While hypotensive, the patient is hemodynamically stable and her lactic acid is normal. -Inpatient admission -Monitor closely on progressive -Continue aggressive fluid resuscitation -Follow-up blood and urine cultures -Continue empiric IV Rocephin   Hypothyroidism-Synthroid   Hypertension-hold antihypertensives  Hyperlipidemia-Pravachol  GERD-omeprazole  DVT prophylaxis: Subcutaneous heparin     Code Status: Full Code, per patient's daughter at the bedside.  She will discuss with her brother and they may potentially change her CODE STATUS.  Consults called: None  Admission status: The appropriate patient status  for this patient is INPATIENT. Inpatient status is judged to be reasonable and necessary in order to provide the required intensity of service to ensure the patient's safety. The patient's presenting symptoms, physical exam findings, and initial radiographic and laboratory data in the context of their chronic comorbidities is felt to place them at high risk for further clinical deterioration. Furthermore, it is not anticipated that the patient will be medically stable for discharge from the hospital within 2 midnights of admission.    I certify that at the point of admission it is my clinical judgment that the patient will require inpatient hospital care spanning beyond 2 midnights from the point of admission due to high intensity of service, high risk for further deterioration and high frequency of surveillance required  Time spent: 56 minutes  Bohdi Leeds CHRISTELLA Gail MD [facility] Pager [phone]  If 7PM-7AM, please contact night-coverage [email or web address] Password [email or web address]  10/31/2024, 2:19 PM

## 2024-10-31 NOTE — Sepsis Progress Note (Signed)
 Notified bedside nurse of need to draw lactic acid.

## 2024-10-31 NOTE — Sepsis Progress Note (Signed)
 Elink monitoring for the code sepsis protocol.

## 2024-10-31 NOTE — ED Provider Notes (Signed)
 [facility] EM[facility]MENT AT [facility] Provider Note   CSN: [id number] Arrival date & time: 10/31/24  1048     Patient presents with: Altered Mental Status, Fever, Nausea, and Emesis   Susan Mayer is a 83 y.o. female.   Level 5, due to dementia.  Patient brought in by EMS for nausea vomiting fever decreased mental status last few days.  She lives at facility has a history of dementia.  Not able to provide much history.  Possible UTI history.  The history is provided by the EMS personnel.       Prior to Admission medications   Medication Sig Start Date End Date Taking? Authorizing Provider  dicyclomine  (BENTYL ) 20 MG tablet Take 1 tablet (20 mg total) by mouth 2 (two) times daily. 02/14/21   Neldon Hamp RAMAN, PA  dicyclomine  (BENTYL ) 20 MG tablet TAKE 1 TABLET BY MOUTH TWICE DAILY 02/14/21 02/14/22  Neldon Hamp RAMAN, PA  levothyroxine  (SYNTHROID ) 50 MCG tablet TAKE 1 TABLET BY MOUTH EVERY DAY AT 6 AM 11/30/15   [provider]  losartan (COZAAR) 25 MG tablet TK 1 T PO QD 12/08/15   [provider]  omeprazole (PRILOSEC) 40 MG capsule Take 1 capsule by mouth daily. 04/22/18   [provider]  pravastatin (PRAVACHOL) 20 MG tablet Take 1 tablet by mouth daily. 11/30/15   [provider]  sucralfate  (CARAFATE ) 1 g tablet Take 1 tablet (1 g total) by mouth 4 (four) times daily -  with meals and at bedtime. 02/14/21   Neldon Hamp RAMAN, PA  sucralfate  (CARAFATE ) 1 g tablet TAKE 1 TABLET BY MOUTH 4 TIMES DAILY WITH MEALS AND AT BEDTIME 02/14/21 02/14/22  Neldon Hamp RAMAN, PA    Allergies: Penicillins and Sulfa antibiotics    Review of Systems  Updated Vital Signs BP 115/70   Pulse 78   Temp (!) 102 F (38.9 C) (Rectal)   Resp (!) 26   SpO2 95%   Physical Exam Vitals and nursing note reviewed.  Constitutional:      General: She is not in acute distress.    Appearance: She is well-developed.     Comments: She is slightly  uncomfortable appearing but awake moves all extremities  HENT:     Head: Normocephalic and atraumatic.     Mouth/Throat:     Mouth: Mucous membranes are moist.  Eyes:     Extraocular Movements: Extraocular movements intact.     Conjunctiva/sclera: Conjunctivae normal.     Pupils: Pupils are equal, round, and reactive to light.  Cardiovascular:     Rate and Rhythm: Normal rate and regular rhythm.     Pulses: Normal pulses.     Heart sounds: Normal heart sounds. No murmur heard. Pulmonary:     Effort: Pulmonary effort is normal. No respiratory distress.     Breath sounds: Normal breath sounds.  Abdominal:     Palpations: Abdomen is soft.     Tenderness: There is no abdominal tenderness.  Musculoskeletal:        General: No swelling.     Cervical back: Neck supple.  Skin:    General: Skin is warm and dry.     Capillary Refill: Capillary refill takes less than 2 seconds.  Neurological:     General: No focal deficit present.     Mental Status: She is alert.  Psychiatric:        Mood and Affect: Mood normal.     (all labs ordered are  listed, but only abnormal results are displayed) Labs Reviewed  CBC WITH DIFFERENTIAL/PLATELET - Abnormal; Notable for the following components:      Result Value   WBC 19.1 (*)    RBC 5.17 (*)    Neutro Abs 16.7 (*)    Monocytes Absolute 1.4 (*)    Abs Immature Granulocytes 0.14 (*)    All other components within normal limits  COMPREHENSIVE METABOLIC PANEL WITH GFR - Abnormal; Notable for the following components:   Glucose, Bld 121 (*)    BUN 26 (*)    Creatinine, Ser 1.44 (*)    GFR, Estimated 36 (*)    All other components within normal limits  URINALYSIS, ROUTINE W REFLEX MICROSCOPIC - Abnormal; Notable for the following components:   Color, Urine AMBER (*)    APPearance TURBID (*)    Hgb urine dipstick LARGE (*)    Protein, ur 100 (*)    Leukocytes,Ua MODERATE (*)    Bacteria, UA MANY (*)    All other components within normal  limits  RESP PANEL BY RT-PCR (RSV, FLU A&B, COVID)  RVPGX2  CULTURE, BLOOD (ROUTINE X 2)  CULTURE, BLOOD (ROUTINE X 2)  LIPASE, BLOOD  PROTIME-INR  I-STAT CG4 LACTIC ACID, ED  CBG MONITORING, ED  I-STAT CG4 LACTIC ACID, ED    EKG: EKG Interpretation Date/Time:  Tuesday October 31 2024 11:26:36 EST Ventricular Rate:  82 PR Interval:  115 QRS Duration:  85 QT Interval:  402 QTC Calculation: 470 R Axis:   -61  Text Interpretation: Ectopic atrial rhythm Confirmed by Ruthe Cornet [id number]) on 10/31/2024 11:58:55 AM  Radiology: [facility] Chest Portable 1 View Result Date: 10/31/2024 EXAM: 1 VIEW(S) XRAY OF THE CHEST 10/31/2024 12:10:00 PM COMPARISON: None available. CLINICAL HISTORY: fever FINDINGS: LUNGS AND PLEURA: Patchy retrocardiac opacities. Possible small left pleural effusion. No pneumothorax. HEART AND MEDIASTINUM: Aortic atherosclerosis. No acute abnormality of the cardiac and mediastinal silhouettes. BONES AND SOFT TISSUES: No acute osseous abnormality. IMPRESSION: 1. Patchy retrocardiac opacities, atelectasis versus infection. 2. Possible small left pleural effusion. Electronically signed by: Donnice Mania MD 10/31/2024 12:51 PM EST RP Workstation: [id number]     .Critical Care  Performed by: Ruthe Cornet, DO Authorized by: Ruthe Cornet, DO   Critical care provider statement:    Critical care time (minutes):  35   Critical care was necessary to treat or prevent imminent or life-threatening deterioration of the following conditions:  Sepsis    Medications Ordered in the ED  lactated ringers  infusion ( Intravenous New Bag/Given 10/31/24 1209)  azithromycin  (ZITHROMAX ) 500 mg in sodium chloride  0.9 % 250 mL IVPB (500 mg Intravenous New Bag/Given 10/31/24 1321)  sodium chloride  0.9 % bolus 1,000 mL (1,000 mLs Intravenous New Bag/Given 10/31/24 1323)  ondansetron  (ZOFRAN ) injection 4 mg (4 mg Intravenous Given 10/31/24 1207)  cefTRIAXone  (ROCEPHIN ) 2 g in sodium chloride  0.9  % 100 mL IVPB (0 g Intravenous Stopped 10/31/24 1304)  acetaminophen  (TYLENOL ) suppository 650 mg (650 mg Rectal Given 10/31/24 1212)                                    Medical Decision Making Amount and/or Complexity of Data Reviewed Labs: ordered. Radiology: ordered.  Risk OTC drugs. Prescription drug management. Decision regarding hospitalization.   Susan Mayer is here altered mental status.  Found to have rectal temperature of 102.  History of dementia, hypertension high cholesterol.  Will pursue  infectious workup start some IV antibiotics to cover for possible UTI.  Sounds like she has had some nausea and vomiting the last few days change in mental status here the last few days.  Will try to talk with family on the phone.  Will get blood cultures lactic chest x-ray IV fluids IV Zofran  IV antibiotics anticipate admission.  She is hemodynamically stable otherwise.  Per my review interpretation the labs patient does have white count of 19, analysis consistent with urinary tract infection.  Lactic acid is normal.  Chest x-ray with possible infection but I do think urinary source is likely.  She is hemodynamically stable.  Will admit for further sepsis care with hospitalist team.  Otherwise no significant leukocytosis.  Creatinine is 1.4.  This chart was dictated using voice recognition software.  Despite best efforts to proofread,  errors can occur which can change the documentation meaning.      Final diagnoses:  Lower urinary tract infectious disease  Sepsis, due to unspecified organism, unspecified whether acute organ dysfunction present [facility])    ED Discharge Orders     None          Ruthe Cornet, DO 10/31/24 1331

## 2024-10-31 NOTE — ED Triage Notes (Signed)
 Per EMS patient coming from memory care center with n/v on Friday, Saturday patient became more altered than usual, BP 110/76 HR 88 96% RA 138 CBG T 100.2. HX of UTI. Patient is confused. Airway patent, respirations even and unlabored. Skin normal, warm and dry. patient has no complaints at this time. Siderails up x 2.

## 2024-11-01 DIAGNOSIS — A419 Sepsis, unspecified organism: Secondary | ICD-10-CM | POA: Diagnosis not present

## 2024-11-01 DIAGNOSIS — I10 Essential (primary) hypertension: Secondary | ICD-10-CM

## 2024-11-01 LAB — CBC
HCT: 36.8 % (ref 36.0–46.0)
Hemoglobin: 11.4 g/dL — ABNORMAL LOW (ref 12.0–15.0)
MCH: 26.5 pg (ref 26.0–34.0)
MCHC: 31 g/dL (ref 30.0–36.0)
MCV: 85.6 fL (ref 80.0–100.0)
Platelets: 174 K/uL (ref 150–400)
RBC: 4.3 MIL/uL (ref 3.87–5.11)
RDW: 15.1 % (ref 11.5–15.5)
WBC: 8.8 K/uL (ref 4.0–10.5)
nRBC: 0 % (ref 0.0–0.2)

## 2024-11-01 LAB — BASIC METABOLIC PANEL WITH GFR
Anion gap: 7 (ref 5–15)
BUN: 21 mg/dL (ref 8–23)
CO2: 28 mmol/L (ref 22–32)
Calcium: 8.3 mg/dL — ABNORMAL LOW (ref 8.9–10.3)
Chloride: 106 mmol/L (ref 98–111)
Creatinine, Ser: 1.05 mg/dL — ABNORMAL HIGH (ref 0.44–1.00)
GFR, Estimated: 52 mL/min — ABNORMAL LOW (ref >60)
Glucose, Bld: 89 mg/dL (ref 70–99)
Potassium: 3.9 mmol/L (ref 3.5–5.1)
Sodium: 140 mmol/L (ref 135–145)

## 2024-11-01 NOTE — Progress Notes (Signed)
 Mobility Specialist - Progress Note   11/01/24 1400  Mobility  Activity Pivoted/transferred from bed to chair  Level of Assistance Moderate assist, patient does 50-74%  Assistive Device Front wheel walker  Range of Motion/Exercises Active  Activity Response Tolerated well  Mobility visit 1 Mobility  Mobility Specialist Start Time (ACUTE ONLY) 1350  Mobility Specialist Stop Time (ACUTE ONLY) 1400  Mobility Specialist Time Calculation (min) (ACUTE ONLY) 10 min   Pt was found in bed and agreeable to mobilize. Required multiple cues during session. At EOS was left on recliner chair with all needs met. Call bell in reach and RN notified.   Erminio Leos,  Mobility Specialist Can be reached via Secure Chat

## 2024-11-01 NOTE — Progress Notes (Signed)
 [facility]                                                                               Susan Mayer, is a 83 y.o. female, DOB - [birth year], FMW:[id number] Admit date - 10/31/2024    Outpatient Primary MD for the patient is Patient, No Pcp Per  LOS - 1  days    Brief summary     Susan Mayer is a 83 y.o. female with medical history significant for hypertension, hyperlipidemia and dementia resident of memory care unit admitted to the hospital with sepsis due to UTI.  History provided by the patient's daughter who is at the bedside, says that she and her brother to visit the patient quite frequently.  About 4 days ago, they were notified by the facility that the patient was more somnolent than usual, they went to visit her the next day and she was quite sleepy, not wanting to eat and not wanting to get out of bed.  She had no specific complaints, as far as daughter knows there was no fever, vomiting, or complaints of pain.  Workup in the ER consistent with sepsis due to UTI, patient was started on empiric IV antibiotics and hospitalist admission was requested.   Assessment & Plan    Assessment and Plan:    Severe sepsis due to UTI Patient meeting criteria with fever leukocytosis, hypotension and AKI Start the patient on IV Rocephin . Blood cultures negative so far. Urine cultures were not drawn on admission.    Hypothyroidism Continue with Synthroid    Hypertension Blood pressure parameters have been optimal.   Hyperlipidemia Continue with Pravachol   GERD Stable.    Estimated body mass index is 21.46 kg/m as calculated from the following:   Height as of 08/03/23: 5' 4 (1.626 m).   Weight as of 08/03/23: 56.7 kg.  Code Status: Full code DVT Prophylaxis:  heparin  injection 5,000 Units Start: 10/31/24 1430   Level of Care: Level of care: Progressive Family Communication: Updated patient's daughter at bedside  Disposition Plan:     Remains inpatient  appropriate: IV antibiotics for another 24 hours.  Procedures:  None  Consultants:   None  Antimicrobials:   Anti-infectives (From admission, onward)    Start     Dose/Rate Route Frequency Ordered Stop   11/01/24 1200  cefTRIAXone  (ROCEPHIN ) 1 g in sodium chloride  0.9 % 100 mL IVPB        1 g 200 mL/hr over 30 Minutes Intravenous Every 24 hours 10/31/24 1410     10/31/24 1315  azithromycin  (ZITHROMAX ) 500 mg in sodium chloride  0.9 % 250 mL IVPB        500 mg 250 mL/hr over 60 Minutes Intravenous  Once 10/31/24 1307 10/31/24 1509   10/31/24 1115  cefTRIAXone  (ROCEPHIN ) 2 g in sodium chloride  0.9 % 100 mL IVPB        2 g 200 mL/hr over 30 Minutes Intravenous Once 10/31/24 1114 10/31/24 1304        Medications  Scheduled Meds:  atorvastatin   40 mg Oral Daily   busPIRone   15 mg Oral BID   donepezil   10 mg Oral QHS  heparin   5,000 Units Subcutaneous Q8H   levothyroxine   50 mcg Oral Q0600   loratadine   10 mg Oral Daily   pantoprazole   40 mg Oral Daily   sertraline   25 mg Oral Daily   Continuous Infusions:  cefTRIAXone  (ROCEPHIN )  IV 1 g (11/01/24 1115)   PRN Meds:.acetaminophen  **OR** acetaminophen , albuterol , ondansetron  **OR** ondansetron  (ZOFRAN ) IV, traZODone     Subjective:   Liviya Santini was seen and examined today.   No new complaints.   Objective:   Vitals:   10/31/24 2347 11/01/24 0346 11/01/24 0754 11/01/24 1223  BP: (!) 99/59 129/68 125/63 (!) 107/51  Pulse: (!) 57 (!) 50 75 68  Resp:   16 14  Temp: 97.7 F (36.5 C) 98 F (36.7 C) 98.5 F (36.9 C) 98.1 F (36.7 C)  TempSrc: Oral Oral Oral Oral  SpO2: 98% 96% 96% 96%    Intake/Output Summary (Last 24 hours) at 11/01/2024 1302 Last data filed at 11/01/2024 9092 Gross per 24 hour  Intake 2940.86 ml  Output 400 ml  Net 2540.86 ml   There were no vitals filed for this visit.   Exam General exam: Appears calm and comfortable  Respiratory system: Clear to auscultation. Respiratory effort  normal. Cardiovascular system: S1 & S2 heard, RRR.  Gastrointestinal system: Abdomen is nondistended, soft and nontender.  Central nervous system: Alert and oriented.  Extremities: Symmetric 5 x 5 power. Skin: No rashes,  Psychiatry: Mood & affect appropriate.     Data Reviewed:  I have personally reviewed following labs and imaging studies   CBC Lab Results  Component Value Date   WBC 8.8 11/01/2024   RBC 4.30 11/01/2024   HGB 11.4 (L) 11/01/2024   HCT 36.8 11/01/2024   MCV 85.6 11/01/2024   MCH 26.5 11/01/2024   PLT 174 11/01/2024   MCHC 31.0 11/01/2024   RDW 15.1 11/01/2024   LYMPHSABS 0.9 10/31/2024   MONOABS 1.4 (H) 10/31/2024   EOSABS 0.0 10/31/2024   BASOSABS 0.0 10/31/2024     Last metabolic panel Lab Results  Component Value Date   NA 140 11/01/2024   K 3.9 11/01/2024   CL 106 11/01/2024   CO2 28 11/01/2024   BUN 21 11/01/2024   CREATININE 1.05 (H) 11/01/2024   GLUCOSE 89 11/01/2024   GFRNONAA 52 (L) 11/01/2024   GFRAA >60 07/01/2019   CALCIUM  8.3 (L) 11/01/2024   PROT 7.1 10/31/2024   ALBUMIN 3.6 10/31/2024   BILITOT 0.9 10/31/2024   ALKPHOS 95 10/31/2024   AST 40 10/31/2024   ALT 25 10/31/2024   ANIONGAP 7 11/01/2024    CBG (last 3)  No results for input(s): GLUCAP in the last 72 hours.    Coagulation Profile: No results for input(s): INR, PROTIME in the last 168 hours.   Radiology Studies: DG Chest Portable 1 View Result Date: 10/31/2024 EXAM: 1 VIEW(S) XRAY OF THE CHEST 10/31/2024 12:10:00 PM COMPARISON: None available. CLINICAL HISTORY: fever FINDINGS: LUNGS AND PLEURA: Patchy retrocardiac opacities. Possible small left pleural effusion. No pneumothorax. HEART AND MEDIASTINUM: Aortic atherosclerosis. No acute abnormality of the cardiac and mediastinal silhouettes. BONES AND SOFT TISSUES: No acute osseous abnormality. IMPRESSION: 1. Patchy retrocardiac opacities, atelectasis versus infection. 2. Possible small left pleural effusion.  Electronically signed by: Donnice Mania MD 10/31/2024 12:51 PM EST RP Workstation: [id number]       Elgie Butter M.D. [facility] 11/01/2024, 1:02 PM  Available via Epic secure chat 7am-7pm After 7 pm, please refer to night coverage provider listed  on amion.

## 2024-11-01 NOTE — Plan of Care (Signed)

## 2024-11-01 NOTE — TOC Initial Note (Signed)
 Transition of Care [facility]) - Initial/Assessment Note   Patient Details  Name: Susan Mayer MRN: [id number] Date of Birth: [birth year]  Transition of Care [facility] CM/SW Contact:    Duwaine GORMAN Aran, LCSW Phone Number: 11/01/2024, 10:32 AM  Clinical Narrative: Patient is from memory care and appears to be from [facility] [phone]). CSW attempted to call daughter, but was unable to reach her so voicemail was left requesting call back. CSW left voicemail for [name] with [facility] requesting call back.  Expected Discharge Plan: Memory Care Barriers to Discharge: Continued Medical Work up  Patient Goals and CMS Choice Patient states their goals for this hospitalization and ongoing recovery are:: Oriented to self only  Expected Discharge Plan and Services In-house Referral: Clinical Social Work Living arrangements for the past 2 months: Assisted Living Facility           DME Arranged: N/A DME Agency: NA  Prior Living Arrangements/Services Living arrangements for the past 2 months: Assisted Living Facility Lives with:: Facility Resident Patient language and need for interpreter reviewed:: Yes Do you feel safe going back to the place where you live?: Yes      Need for Family Participation in Patient Care: Yes (Comment) (Patient oriented to self only.) Care giver support system in place?: Yes (comment) Criminal Activity/Legal Involvement Pertinent to Current Situation/Hospitalization: No - Comment as needed  Activities of Daily Living ADL Screening (condition at time of admission) Independently performs ADLs?: No Does the patient have a NEW difficulty with bathing/dressing/toileting/self-feeding that is expected to last >3 days?: No Does the patient have a NEW difficulty with getting in/out of bed, walking, or climbing stairs that is expected to last >3 days?: No Does the patient have a NEW difficulty with communication that is expected to last >3 days?:  No Is the patient deaf or have difficulty hearing?: Yes Does the patient have difficulty seeing, even when wearing glasses/contacts?: No Does the patient have difficulty concentrating, remembering, or making decisions?: Yes  Emotional Assessment Attitude/Demeanor/Rapport: Unable to Assess Affect (typically observed): Unable to Assess Orientation: : Oriented to Self Alcohol / Substance Use: Not Applicable Psych Involvement: No (comment)  Admission diagnosis:  Lower urinary tract infectious disease [N39.0] Sepsis secondary to UTI (HCC) [A41.9, N39.0] Sepsis, due to unspecified organism, unspecified whether acute organ dysfunction present [facility]) [A41.9] Patient Active Problem List   Diagnosis Date Noted   Sepsis secondary to UTI (HCC) 10/31/2024   PCP:  Patient, No Pcp Per Pharmacy:   [id number] - [place], [place] - [address] AT [facility] [address] [place] [address] Phone: [phone] Fax: [phone]  [facility] [place] [facility] [address], [address] [place] [address] Phone: [phone] Fax: [phone]  [facility] - [facility] [address] [place] [address] Phone: [phone] Fax: [phone]  Social Drivers of Health (SDOH) Social History: SDOH Screenings   Food Insecurity: No Food Insecurity (10/31/2024)  Housing: Low Risk  (10/31/2024)  Transportation Needs: No Transportation Needs (10/31/2024)  Utilities: Not At Risk (10/31/2024)  Social Connections: Patient Declined (10/31/2024)  Tobacco Use: Low Risk  (08/03/2023)   SDOH Interventions:    Readmission Risk Interventions     No data to display

## 2024-11-02 ENCOUNTER — Encounter (HOSPITAL_COMMUNITY): Payer: Self-pay | Admitting: Internal Medicine

## 2024-11-02 DIAGNOSIS — A419 Sepsis, unspecified organism: Secondary | ICD-10-CM | POA: Diagnosis not present

## 2024-11-02 DIAGNOSIS — I10 Essential (primary) hypertension: Secondary | ICD-10-CM | POA: Diagnosis not present

## 2024-11-02 NOTE — Progress Notes (Signed)
 [facility]                                                                               Susan Mayer, is a 83 y.o. female, DOB - 07-04-1941, FMW:[id number] Admit date - 10/31/2024    Outpatient Primary MD for the patient is Patient, No Pcp Per  LOS - 2  days    Brief summary     Susan Mayer is a 83 y.o. female with medical history significant for hypertension, hyperlipidemia and dementia resident of memory care unit admitted to the hospital with sepsis due to UTI.  History provided by the patient's daughter who is at the bedside, says that she and her brother to visit the patient quite frequently.  About 4 days ago, they were notified by the facility that the patient was more somnolent than usual, they went to visit her the next day and she was quite sleepy, not wanting to eat and not wanting to get out of bed.  She had no specific complaints, as far as daughter knows there was no fever, vomiting, or complaints of pain.  Workup in the ER consistent with sepsis due to UTI, patient was started on empiric IV antibiotics and hospitalist admission was requested.   Assessment & Plan    Assessment and Plan:    Severe sepsis due to UTI Patient meeting criteria with fever leukocytosis, hypotension and AKI Start the patient on IV Rocephin . Continue for another 24 hours.  Blood cultures negative so far. Urine cultures were not drawn on admission. No new cmplaints. Her VS have been stable.     Hypothyroidism Continue with Synthroid    Hypertension Blood pressure parameters  are optimal.    Hyperlipidemia Continue with Pravachol   GERD Stable.    Estimated body mass index is 21.46 kg/m as calculated from the following:   Height as of 08/03/23: 5' 4 (1.626 m).   Weight as of 08/03/23: 56.7 kg.  Code Status: Full code DVT Prophylaxis:  heparin  injection 5,000 Units Start: 10/31/24 1430   Level of Care: Level of care: Progressive Family Communication: Updated  patient's daughter at bedside  Disposition Plan:     Remains inpatient appropriate: IV antibiotics for another 24 hours.  Procedures:  None  Consultants:   None  Antimicrobials:   Anti-infectives (From admission, onward)    Start     Dose/Rate Route Frequency Ordered Stop   11/01/24 1200  cefTRIAXone  (ROCEPHIN ) 1 g in sodium chloride  0.9 % 100 mL IVPB        1 g 200 mL/hr over 30 Minutes Intravenous Every 24 hours 10/31/24 1410     10/31/24 1315  azithromycin  (ZITHROMAX ) 500 mg in sodium chloride  0.9 % 250 mL IVPB        500 mg 250 mL/hr over 60 Minutes Intravenous  Once 10/31/24 1307 10/31/24 1509   10/31/24 1115  cefTRIAXone  (ROCEPHIN ) 2 g in sodium chloride  0.9 % 100 mL IVPB        2 g 200 mL/hr over 30 Minutes Intravenous Once 10/31/24 1114 10/31/24 1304        Medications  Scheduled Meds:  atorvastatin   40 mg Oral Daily  busPIRone   15 mg Oral BID   donepezil   10 mg Oral QHS   heparin   5,000 Units Subcutaneous Q8H   levothyroxine   50 mcg Oral Q0600   loratadine   10 mg Oral Daily   pantoprazole   40 mg Oral Daily   sertraline   25 mg Oral Daily   Continuous Infusions:  cefTRIAXone  (ROCEPHIN )  IV 1 g (11/01/24 1115)   PRN Meds:.acetaminophen  **OR** acetaminophen , albuterol , ondansetron  **OR** ondansetron  (ZOFRAN ) IV, traZODone     Subjective:   Susan Mayer was seen and examined today.   Hard of hearing, comfortable.   Objective:   Vitals:   11/01/24 0754 11/01/24 1223 11/01/24 2031 11/02/24 0611  BP: 125/63 (!) 107/51 120/75 (!) 156/94  Pulse: 75 68 69 69  Resp: 16 14 18 16   Temp: 98.5 F (36.9 C) 98.1 F (36.7 C) 99.5 F (37.5 C) 100.1 F (37.8 C)  TempSrc: Oral Oral Oral Oral  SpO2: 96% 96% 96% 96%    Intake/Output Summary (Last 24 hours) at 11/02/2024 1225 Last data filed at 11/02/2024 9078 Gross per 24 hour  Intake 120 ml  Output 450 ml  Net -330 ml   There were no vitals filed for this visit.   Exam General exam: Appears calm and  comfortable  Respiratory system: Clear to auscultation. Respiratory effort normal. Cardiovascular system: S1 & S2 heard, RRR. No JVD, Gastrointestinal system: Abdomen is nondistended, soft and nontender.  Central nervous system: Alert and oriented to person. Extremities: Symmetric 5 x 5 power. Skin: No rashes, lesions or ulcers Psychiatry:  Mood & affect appropriate.      Data Reviewed:  I have personally reviewed following labs and imaging studies   CBC Lab Results  Component Value Date   WBC 8.8 11/01/2024   RBC 4.30 11/01/2024   HGB 11.4 (L) 11/01/2024   HCT 36.8 11/01/2024   MCV 85.6 11/01/2024   MCH 26.5 11/01/2024   PLT 174 11/01/2024   MCHC 31.0 11/01/2024   RDW 15.1 11/01/2024   LYMPHSABS 0.9 10/31/2024   MONOABS 1.4 (H) 10/31/2024   EOSABS 0.0 10/31/2024   BASOSABS 0.0 10/31/2024     Last metabolic panel Lab Results  Component Value Date   NA 140 11/01/2024   K 3.9 11/01/2024   CL 106 11/01/2024   CO2 28 11/01/2024   BUN 21 11/01/2024   CREATININE 1.05 (H) 11/01/2024   GLUCOSE 89 11/01/2024   GFRNONAA 52 (L) 11/01/2024   GFRAA >60 07/01/2019   CALCIUM  8.3 (L) 11/01/2024   PROT 7.1 10/31/2024   ALBUMIN 3.6 10/31/2024   BILITOT 0.9 10/31/2024   ALKPHOS 95 10/31/2024   AST 40 10/31/2024   ALT 25 10/31/2024   ANIONGAP 7 11/01/2024    CBG (last 3)  No results for input(s): GLUCAP in the last 72 hours.    Coagulation Profile: No results for input(s): INR, PROTIME in the last 168 hours.   Radiology Studies: No results found.      Elgie Butter M.D. [facility] 11/02/2024, 12:25 PM  Available via Epic secure chat 7am-7pm After 7 pm, please refer to night coverage provider listed on amion.

## 2024-11-02 NOTE — Plan of Care (Signed)

## 2024-11-03 ENCOUNTER — Other Ambulatory Visit (HOSPITAL_COMMUNITY): Payer: Self-pay

## 2024-11-03 DIAGNOSIS — N3090 Cystitis, unspecified without hematuria: Secondary | ICD-10-CM | POA: Diagnosis not present

## 2024-11-03 DIAGNOSIS — A419 Sepsis, unspecified organism: Secondary | ICD-10-CM | POA: Diagnosis not present

## 2024-11-03 MED ORDER — CEPHALEXIN 500 MG PO CAPS
500.0000 mg | ORAL_CAPSULE | Freq: Two times a day (BID) | ORAL | 0 refills | Status: AC
  Filled 2024-11-03 (×2): qty 10, 5d supply, fill #0

## 2024-11-03 NOTE — Discharge Summary (Signed)
 Physician Discharge Summary   Patient: Susan Mayer MRN: [id number] DOB: 1941/04/30  Admit date:     10/31/2024  Discharge date: 11/03/24  Discharge Physician: Elgie Butter   PCP: Patient, No Pcp Per   Recommendations at discharge:  Please follow up with PCP in one week.  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   Discharge Diagnoses: Principal Problem:   Sepsis secondary to UTI [facility])   Hospital Course:  Susan Mayer is a 83 y.o. female with medical history significant for hypertension, hyperlipidemia and dementia resident of memory care unit admitted to the hospital with sepsis due to UTI.  History provided by the patient's daughter who is at the bedside, says that she and her brother to visit the patient quite frequently.  About 4 days ago, they were notified by the facility that the patient was more somnolent than usual, they went to visit her the next day and she was quite sleepy, not wanting to eat and not wanting to get out of bed.  She had no specific complaints, as far as daughter knows there was no fever, vomiting, or complaints of pain.  Workup in the ER consistent with sepsis due to UTI, patient was started on empiric IV antibiotics and hospitalist admission was requested.   Assessment and Plan:    Severe sepsis due to UTI Patient meeting criteria with fever leukocytosis, hypotension and AKI Start the patient on IV Rocephin .  Completed 3 days, transition to oral keflex on discharge.  Blood cultures negative so far. Urine cultures were not drawn on admission. No new cmplaints. Her VS have been stable.        Hypothyroidism Continue with Synthroid      Hypertension Blood pressure parameters  are optimal.      Hyperlipidemia Continue with Pravachol     GERD Stable.       Estimated body mass index is 21.46 kg/m as calculated from the following:   Height as of 08/03/23: 5' 4 (1.626 m).   Weight as of 08/03/23: 56.7 kg.     Consultants: none.   Procedures performed: none.   Disposition: Long term care facility Diet recommendation:  Discharge Diet Orders (From admission, onward)     Start     Ordered   11/03/24 0000  Diet - low sodium heart healthy        11/03/24 1120           Regular diet DISCHARGE MEDICATION: Allergies as of 11/03/2024       Reactions   Penicillins    Sulfa Antibiotics         Medication List     TAKE these medications    acetaminophen  325 MG tablet Commonly known as: TYLENOL  Take 650 mg by mouth every 6 (six) hours as needed for mild pain (pain score 1-3) or moderate pain (pain score 4-6).   atorvastatin  40 MG tablet Commonly known as: LIPITOR Take 40 mg by mouth daily.   busPIRone  15 MG tablet Commonly known as: BUSPAR  Take 15 mg by mouth 2 (two) times daily.   cephALEXin 500 MG capsule Commonly known as: KEFLEX Take 1 capsule (500 mg total) by mouth 2 (two) times daily for 5 days.   cetirizine 10 MG tablet Commonly known as: ZYRTEC Take 10 mg by mouth daily.   donepezil  10 MG tablet Commonly known as: ARICEPT  Take 10 mg by mouth at bedtime.   hydrOXYzine 10 MG tablet Commonly known as: ATARAX Take 10 mg by mouth 3 (  three) times daily as needed for anxiety.   levothyroxine  50 MCG tablet Commonly known as: SYNTHROID  TAKE 1 TABLET BY MOUTH EVERY DAY AT 6 AM   megestrol 20 MG tablet Commonly known as: MEGACE Take 20 mg by mouth 2 (two) times daily.   omeprazole 40 MG capsule Commonly known as: PRILOSEC Take 1 capsule by mouth daily.   sertraline  25 MG tablet Commonly known as: ZOLOFT  Take 25 mg by mouth daily.   Vitamin D3 1.25 MG (50000 UT) Caps Take 1 capsule by mouth once a week.        Follow-up Information     primary care physician. Schedule an appointment as soon as possible for a visit in 1 week(s).                 Discharge Exam: Filed Weights   11/02/24 1425  Weight: 54.6 kg   General exam: Appears calm and comfortable   Respiratory system: Clear to auscultation. Respiratory effort normal. Cardiovascular system: S1 & S2 heard, RRR.  Gastrointestinal system: Abdomen is nondistended, soft and nontender.  Central nervous system: Alert and oriented.  Extremities: Symmetric 5 x 5 power. Skin: No rashes, Psychiatry: Mood & affect appropriate.    Condition at discharge: fair  The results of significant diagnostics from this hospitalization (including imaging, microbiology, ancillary and laboratory) are listed below for reference.   Imaging Studies: DG Chest Portable 1 View Result Date: 10/31/2024 EXAM: 1 VIEW(S) XRAY OF THE CHEST 10/31/2024 12:10:00 PM COMPARISON: None available. CLINICAL HISTORY: fever FINDINGS: LUNGS AND PLEURA: Patchy retrocardiac opacities. Possible small left pleural effusion. No pneumothorax. HEART AND MEDIASTINUM: Aortic atherosclerosis. No acute abnormality of the cardiac and mediastinal silhouettes. BONES AND SOFT TISSUES: No acute osseous abnormality. IMPRESSION: 1. Patchy retrocardiac opacities, atelectasis versus infection. 2. Possible small left pleural effusion. Electronically signed by: Donnice Mania MD 10/31/2024 12:51 PM EST RP Workstation: [id number]    Microbiology: Results for orders placed or performed during the hospital encounter of 10/31/24  Resp panel by RT-PCR (RSV, Flu A&B, Covid) Urine, Clean Catch     Status: None   Collection Time: 10/31/24 11:33 AM   Specimen: Urine, Clean Catch; Nasal Swab  Result Value Ref Range Status   SARS Coronavirus 2 by RT PCR NEGATIVE NEGATIVE Final    Comment: (NOTE) SARS-CoV-2 target nucleic acids are NOT DETECTED.  The SARS-CoV-2 RNA is generally detectable in upper respiratory specimens during the acute phase of infection. The lowest concentration of SARS-CoV-2 viral copies this assay can detect is 138 copies/mL. A negative result does not preclude SARS-Cov-2 infection and should not be used as the sole basis for treatment  or other patient management decisions. A negative result may occur with  improper specimen collection/handling, submission of specimen other than nasopharyngeal swab, presence of viral mutation(s) within the areas targeted by this assay, and inadequate number of viral copies(<138 copies/mL). A negative result must be combined with clinical observations, patient history, and epidemiological information. The expected result is Negative.  Fact Sheet for Patients:  bloggercourse.com  Fact Sheet for Healthcare Providers:  seriousbroker.it  This test is no t yet approved or cleared by the [place]  FDA and  has been authorized for detection and/or diagnosis of SARS-CoV-2 by FDA under an Emergency Use Authorization (EUA). This EUA will remain  in effect (meaning this test can be used) for the duration of the COVID-19 declaration under Section 564(b)(1) of the Act, 21 U.S.C.section 360bbb-3(b)(1), unless the authorization is terminated  or revoked  sooner.       Influenza A by PCR NEGATIVE NEGATIVE Final   Influenza B by PCR NEGATIVE NEGATIVE Final    Comment: (NOTE) The Xpert Xpress SARS-CoV-2/FLU/RSV plus assay is intended as an aid in the diagnosis of influenza from Nasopharyngeal swab specimens and should not be used as a sole basis for treatment. Nasal washings and aspirates are unacceptable for Xpert Xpress SARS-CoV-2/FLU/RSV testing.  Fact Sheet for Patients: bloggercourse.com  Fact Sheet for Healthcare Providers: seriousbroker.it  This test is not yet approved or cleared by the [place]  FDA and has been authorized for detection and/or diagnosis of SARS-CoV-2 by FDA under an Emergency Use Authorization (EUA). This EUA will remain in effect (meaning this test can be used) for the duration of the COVID-19 declaration under Section 564(b)(1) of the Act, 21 U.S.C. section  360bbb-3(b)(1), unless the authorization is terminated or revoked.     Resp Syncytial Virus by PCR NEGATIVE NEGATIVE Final    Comment: (NOTE) Fact Sheet for Patients: bloggercourse.com  Fact Sheet for Healthcare Providers: seriousbroker.it  This test is not yet approved or cleared by the [place]  FDA and has been authorized for detection and/or diagnosis of SARS-CoV-2 by FDA under an Emergency Use Authorization (EUA). This EUA will remain in effect (meaning this test can be used) for the duration of the COVID-19 declaration under Section 564(b)(1) of the Act, 21 U.S.C. section 360bbb-3(b)(1), unless the authorization is terminated or revoked.  Performed at [facility], [address] [place], [place] [address]   Blood Culture (routine x 2)     Status: None (Preliminary result)   Collection Time: 10/31/24 11:59 AM   Specimen: BLOOD LEFT ARM  Result Value Ref Range Status   Specimen Description   Final    BLOOD LEFT ARM Performed at [facility], [address], [place], [place] [address]    Special Requests   Final    BOTTLES DRAWN AEROBIC AND ANAEROBIC Blood Culture adequate volume Performed at [facility], [address], [place], [place] [address]    Culture   Final    NO GROWTH 2 DAYS Performed at [facility], [address] [place], [place] [address]    Report Status PENDING  Incomplete  Blood Culture (routine x 2)     Status: None (Preliminary result)   Collection Time: 10/31/24 12:50 PM   Specimen: BLOOD LEFT ARM  Result Value Ref Range Status   Specimen Description   Final    BLOOD LEFT ARM Performed at [facility], [address] [place], [place] [address]    Special Requests   Final    BOTTLES DRAWN AEROBIC AND ANAEROBIC Blood Culture results may not be optimal due to an inadequate volume of blood received in culture bottles Performed at  [facility], [address], [place], [place] [address]    Culture   Final    NO GROWTH 2 DAYS Performed at [facility], [address], [place], [place] [address]    Report Status PENDING  Incomplete  MRSA Next Gen by PCR, Nasal     Status: None   Collection Time: 10/31/24  6:19 PM   Specimen: Nasal Mucosa; Nasal Swab  Result Value Ref Range Status   MRSA by PCR Next Gen NOT DETECTED NOT DETECTED Final    Comment: (NOTE) The GeneXpert MRSA Assay (FDA approved for NASAL specimens only), is one component of a comprehensive  MRSA colonization surveillance program. It is not intended to diagnose MRSA infection nor to guide or monitor treatment for MRSA infections. Test performance is not FDA approved in patients less than 40 years old. Performed at [facility], [address], [place], [place] [address]     Labs: CBC: Recent Labs  Lab 10/31/24 1133 11/01/24 0413  WBC 19.1* 8.8  NEUTROABS 16.7*  --   HGB 13.9 11.4*  HCT 43.4 36.8  MCV 83.9 85.6  PLT 191 174   Basic Metabolic Panel: Recent Labs  Lab 10/31/24 1132 10/31/24 1133 11/01/24 0413  NA  --  136 140  K  --  3.9 3.9  CL  --  100 106  CO2  --  24 28  GLUCOSE  --  121* 89  BUN  --  26* 21  CREATININE  --  1.44* 1.05*  CALCIUM   --  9.3 8.3*  MG 2.2  --   --    Liver Function Tests: Recent Labs  Lab 10/31/24 1133  AST 40  ALT 25  ALKPHOS 95  BILITOT 0.9  PROT 7.1  ALBUMIN 3.6   CBG: No results for input(s): GLUCAP in the last 168 hours.  Discharge time spent: 41 minutes.   Signed: Elgie Butter, MD [facility] 11/03/2024

## 2024-11-03 NOTE — [place] FL2 (Signed)
 [place]  MEDICAID [id number] LEVEL OF CARE FORM     IDENTIFICATION  Patient Name: Susan Mayer Birthdate: 07/14/1941 Sex: female Admission Date (Current Location): 10/31/2024  [facility] and [place] Number:  Producer, Television/film/video and Address:  [facility],  [address], [place] [address]      Provider Number: [id number]  Attending Physician Name and Address:  Cherlyn Labella, MD  Relative Name and Phone Number:  [name] (daughter) Ph: [phone]    Current Level of Care: Hospital Recommended Level of Care: Memory Care Prior Approval Number:    Date Approved/Denied:   PASRR Number:    Discharge Plan: SNF    Current Diagnoses: Patient Active Problem List   Diagnosis Date Noted   Sepsis secondary to UTI (HCC) 10/31/2024    Orientation RESPIRATION BLADDER Height & Weight     Self  Normal Incontinent Weight: 54.6 kg Height:  5' 4 (162.6 cm)  BEHAVIORAL SYMPTOMS/MOOD NEUROLOGICAL BOWEL NUTRITION STATUS      Incontinent Diet (regular)  AMBULATORY STATUS COMMUNICATION OF NEEDS Skin   Limited Assist Verbally Skin abrasions (Abrasion: groin)                       Personal Care Assistance Level of Assistance  Bathing, Feeding, Dressing Bathing Assistance: Limited assistance Feeding assistance: Independent Dressing Assistance: Limited assistance     Functional Limitations Info  Sight, Hearing, Speech Sight Info: Adequate Hearing Info: Impaired Speech Info: Adequate    SPECIAL CARE FACTORS FREQUENCY  PT (By licensed PT), OT (By licensed OT)     PT Frequency: 5X/WEEK OT Frequency: 5X/WEEK            Contractures Contractures Info: Not present    Additional Factors Info  Code Status, Allergies Code Status Info: Full Code Allergies Info: Penicillins, Sulfa Antibiotics           Current Medications (11/03/2024):  This is the current hospital active medication list Current Facility-Administered Medications  Medication Dose Route  Frequency Provider Last Rate Last Admin   acetaminophen  (TYLENOL ) tablet 650 mg  650 mg Oral Q6H PRN Zella, Mir M, MD   650 mg at 11/01/24 1607   Or   acetaminophen  (TYLENOL ) suppository 650 mg  650 mg Rectal Q6H PRN Zella, Mir M, MD       albuterol  (PROVENTIL ) (2.5 MG/3ML) 0.083% nebulizer solution 2.5 mg  2.5 mg Nebulization Q2H PRN Zella, Mir M, MD       atorvastatin  (LIPITOR) tablet 40 mg  40 mg Oral Daily Ikramullah, Mir M, MD   40 mg at 11/03/24 9171   busPIRone  (BUSPAR ) tablet 15 mg  15 mg Oral BID Ikramullah, Mir M, MD   15 mg at 11/03/24 9171   cefTRIAXone  (ROCEPHIN ) 1 g in sodium chloride  0.9 % 100 mL IVPB  1 g Intravenous Q24H Zella, Mir M, MD 200 mL/hr at 11/03/24 1152 1 g at 11/03/24 1152   donepezil  (ARICEPT ) tablet 10 mg  10 mg Oral QHS Zella, Mir M, MD   10 mg at 11/01/24 2122   heparin  injection 5,000 Units  5,000 Units Subcutaneous Q8H Zella, Mir M, MD   5,000 Units at 11/03/24 1220   levothyroxine  (SYNTHROID ) tablet 50 mcg  50 mcg Oral Q0600 Zella Katha HERO, MD   50 mcg at 11/03/24 0505   loratadine  (CLARITIN ) tablet 10 mg  10 mg Oral Daily Ikramullah, Mir M, MD   10 mg at 11/03/24 9171   ondansetron  (ZOFRAN ) tablet  4 mg  4 mg Oral Q6H PRN Zella, Mir M, MD       Or   ondansetron  (ZOFRAN ) injection 4 mg  4 mg Intravenous Q6H PRN Zella, Mir M, MD       pantoprazole  (PROTONIX ) EC tablet 40 mg  40 mg Oral Daily Ikramullah, Mir M, MD   40 mg at 11/03/24 9171   sertraline  (ZOLOFT ) tablet 25 mg  25 mg Oral Daily Ikramullah, Mir M, MD   25 mg at 11/03/24 9171   traZODone  (DESYREL ) tablet 25 mg  25 mg Oral QHS PRN Zella Katha HERO, MD         Discharge Medications: Please see discharge summary for a list of discharge medications.  Relevant Imaging Results:  Relevant Lab Results:   Additional Information SSN: [id number]  [name], RN

## 2024-11-03 NOTE — Progress Notes (Signed)
 Mobility Specialist Progress Note:   11/03/24 1041  Mobility  Activity Dangled on edge of bed  Level of Assistance Minimal assist, patient does 75% or more  Assistive Device None  Range of Motion/Exercises Active Assistive  Activity Response Tolerated fair  Mobility Referral Yes  Mobility visit 1 Mobility  Mobility Specialist Start Time (ACUTE ONLY) 1021  Mobility Specialist Stop Time (ACUTE ONLY) 1035  Mobility Specialist Time Calculation (min) (ACUTE ONLY) 14 min   Pt was received in bed and agreed to mobility. Min A bed mobility. Returned to bed under supervision of RN.   [employer] - Mobility Specialist

## 2024-11-03 NOTE — Progress Notes (Signed)
 Discharge med in  a secure bag placed in discharge packet- patient to memory care via [facility]. [place], primary RN, updated via secure chat and in person by this RN

## 2024-11-03 NOTE — TOC Transition Note (Addendum)
 Transition of Care [name]) - Discharge Note   Patient Details  Name: Susan Mayer MRN: [id number] Date of Birth: 1941/02/09  [facility] of [facility]) CM/SW Contact:  [name], RN Phone Number: 11/03/2024, 11:52 AM   Clinical Narrative:    D/C orders received; pt from [facility]; spoke w/ [name], Admissions at facility; she said pt can return, room # C-4 call report # [phone]; d/c address: [address] [place], [place] [address]; Sari also requested orders for HHPT/OT; transport by [facility]; spoke w/ pt's dtr [name]; she agreed to d/c plan; Dr Cherlyn notified orders requested; D/C summary, HHPT/OT orders, FL2, and face to face placed in d/c packet; [facility] called for transport at 1212; spoke w/ [name]; no IP CM needs.   Final next level of care: Memory Care Barriers to Discharge: No Barriers Identified   Patient Goals and CMS Choice Patient states their goals for this hospitalization and ongoing recovery are:: Oriented to self only          Discharge Placement              Patient chooses bed at: [facility] Patient to be transferred to facility by: [facility] Name of family member notified: [name] (daughter) [phone] Patient and family notified of of transfer: 11/03/24  Discharge Plan and Services Additional resources added to the After Visit Summary for   In-house Referral: Clinical Social Work              DME Arranged: N/A DME Agency: NA       HH Arranged: NA HH Agency: NA        Social Drivers of Health (SDOH) Interventions SDOH Screenings   Food Insecurity: No Food Insecurity (10/31/2024)  Housing: Low Risk  (10/31/2024)  Transportation Needs: No Transportation Needs (10/31/2024)  Utilities: Not At Risk (10/31/2024)  Social Connections: Patient Declined (10/31/2024)  Tobacco Use: Low Risk  (11/02/2024)     Readmission Risk Interventions     No data to display

## 2024-11-03 NOTE — Progress Notes (Signed)
 Called [facility]; spoke w/ [name], room # C-4 call at [phone] and gave report.

## 2024-11-05 LAB — CULTURE, BLOOD (ROUTINE X 2)
Culture: NO GROWTH
Culture: NO GROWTH
Special Requests: ADEQUATE

## 2024-11-18 ENCOUNTER — Emergency Department (HOSPITAL_COMMUNITY)

## 2024-11-18 ENCOUNTER — Other Ambulatory Visit: Payer: Self-pay

## 2024-11-18 ENCOUNTER — Emergency Department (HOSPITAL_COMMUNITY)
Admission: EM | Admit: 2024-11-18 | Discharge: 2024-11-19 | Disposition: A | Discharge status: Home / Self Care | Attending: Emergency Medicine | Admitting: Emergency Medicine

## 2024-11-18 DIAGNOSIS — K5900 Constipation, unspecified: Secondary | ICD-10-CM | POA: Insufficient documentation

## 2024-11-18 DIAGNOSIS — R55 Syncope and collapse: Secondary | ICD-10-CM | POA: Insufficient documentation

## 2024-11-18 LAB — CBC WITH DIFFERENTIAL/PLATELET
Abs Immature Granulocytes: 0.04 K/uL (ref 0.00–0.07)
Basophils Absolute: 0 K/uL (ref 0.0–0.1)
Basophils Relative: 1 %
Eosinophils Absolute: 0.1 K/uL (ref 0.0–0.5)
Eosinophils Relative: 1 %
HCT: 44 % (ref 36.0–46.0)
Hemoglobin: 13.3 g/dL (ref 12.0–15.0)
Immature Granulocytes: 1 %
Lymphocytes Relative: 18 %
Lymphs Abs: 1.5 K/uL (ref 0.7–4.0)
MCH: 25.9 pg — ABNORMAL LOW (ref 26.0–34.0)
MCHC: 30.2 g/dL (ref 30.0–36.0)
MCV: 85.8 fL (ref 80.0–100.0)
Monocytes Absolute: 0.6 K/uL (ref 0.1–1.0)
Monocytes Relative: 7 %
Neutro Abs: 5.8 K/uL (ref 1.7–7.7)
Neutrophils Relative %: 72 %
Platelets: 256 K/uL (ref 150–400)
RBC: 5.13 MIL/uL — ABNORMAL HIGH (ref 3.87–5.11)
RDW: 15 % (ref 11.5–15.5)
WBC: 7.9 K/uL (ref 4.0–10.5)
nRBC: 0 % (ref 0.0–0.2)

## 2024-11-18 LAB — COMPREHENSIVE METABOLIC PANEL WITH GFR
ALT: 28 U/L (ref 0–44)
AST: 23 U/L (ref 15–41)
Albumin: 3.5 g/dL (ref 3.5–5.0)
Alkaline Phosphatase: 100 U/L (ref 38–126)
Anion gap: 10 (ref 5–15)
BUN: 11 mg/dL (ref 8–23)
CO2: 25 mmol/L (ref 22–32)
Calcium: 9.2 mg/dL (ref 8.9–10.3)
Chloride: 106 mmol/L (ref 98–111)
Creatinine, Ser: 0.92 mg/dL (ref 0.44–1.00)
GFR, Estimated: >60 mL/min (ref >60)
Glucose, Bld: 113 mg/dL — ABNORMAL HIGH (ref 70–99)
Potassium: 3.9 mmol/L (ref 3.5–5.1)
Sodium: 141 mmol/L (ref 135–145)
Total Bilirubin: 0.4 mg/dL (ref 0.0–1.2)
Total Protein: 6.8 g/dL (ref 6.5–8.1)

## 2024-11-18 LAB — TROPONIN I (HIGH SENSITIVITY): Troponin I (High Sensitivity): 6 ng/L (ref <18)

## 2024-11-18 MED ORDER — DOCUSATE SODIUM 100 MG PO CAPS: 100.0000 mg | ORAL_CAPSULE | Freq: Two times a day (BID) | ORAL | 0 refills | Status: AC

## 2024-11-18 MED ORDER — POLYETHYLENE GLYCOL 3350 17 G PO PACK: 17.0000 g | PACK | Freq: Every day | ORAL | 0 refills | Status: AC

## 2024-11-18 MED ORDER — IOHEXOL 350 MG/ML SOLN
75.0000 mL | Freq: Once | INTRAVENOUS | Status: AC | PRN
  Administered 2024-11-18: 75 mL via INTRAVENOUS

## 2024-11-18 MED ORDER — LACTATED RINGERS IV BOLUS
500.0000 mL | Freq: Once | INTRAVENOUS | Status: AC
  Administered 2024-11-18: 500 mL via INTRAVENOUS

## 2024-11-18 NOTE — ED Notes (Signed)
 Family upset about the wait for [facility]. [facility] services contacted for ETA, no ETA at this time. Daughter in room and needs to go home and prepare for morning flight. Informed daughter that she can go home and we will move the patient to a closer room to the nurses station. Son called and he was also informed that there is no ETA.

## 2024-11-18 NOTE — ED Provider Notes (Signed)
  EMERGENCY DEPARTMENT AT [facility] Provider Note   CSN: [id number] Arrival date & time: 11/18/24  1140     Patient presents with: Loss of Consciousness   Susan Mayer is a 83 y.o. female.   HPI 83 year old female presents with syncope.  Unfortunately history is very limited as the patient has significant dementia, EMS is not present, and no one is answering at the nursing facility.  Patient was reportedly having a hard time passing a bowel movement and then she was straining and then passed out for couple minutes.  No obvious injuries.  The patient does not provide any significant history though does comment on some abdominal pain during the exam.  Prior to Admission medications  Medication Sig Start Date End Date Taking? Authorizing Provider  docusate sodium  (COLACE) 100 MG capsule Take 1 capsule (100 mg total) by mouth every 12 (twelve) hours. 11/18/24  Yes Freddi Hamilton, MD  polyethylene glycol (MIRALAX  / GLYCOLAX ) 17 g packet Take 17 g by mouth daily. 11/18/24  Yes Freddi Hamilton, MD  acetaminophen  (TYLENOL ) 325 MG tablet Take 650 mg by mouth every 6 (six) hours as needed for mild pain (pain score 1-3) or moderate pain (pain score 4-6).    [provider]  atorvastatin  (LIPITOR) 40 MG tablet Take 40 mg by mouth daily.    [provider]  busPIRone  (BUSPAR ) 15 MG tablet Take 15 mg by mouth 2 (two) times daily.    [provider]  cetirizine (ZYRTEC) 10 MG tablet Take 10 mg by mouth daily.    [provider]  Cholecalciferol (VITAMIN D3) 1.25 MG (50000 UT) CAPS Take 1 capsule by mouth once a week.    [provider]  donepezil  (ARICEPT ) 10 MG tablet Take 10 mg by mouth at bedtime.    [provider]  hydrOXYzine (ATARAX) 10 MG tablet Take 10 mg by mouth 3 (three) times daily as needed for anxiety.    [provider]  levothyroxine  (SYNTHROID ) 50 MCG tablet TAKE 1 TABLET BY MOUTH EVERY DAY AT 6 AM  11/30/15   [provider]  megestrol (MEGACE) 20 MG tablet Take 20 mg by mouth 2 (two) times daily.    [provider]  omeprazole (PRILOSEC) 40 MG capsule Take 1 capsule by mouth daily. 04/22/18   [provider]  sertraline  (ZOLOFT ) 25 MG tablet Take 25 mg by mouth daily.    [provider]    Allergies: Penicillins and Sulfa antibiotics    Review of Systems  Unable to perform ROS: Dementia    Updated Vital Signs BP 116/62   Pulse 68   Temp (!) 97.4 F (36.3 C) (Axillary)   Resp 17   SpO2 100%   Physical Exam Vitals and nursing note reviewed.  Constitutional:      General: She is not in acute distress.    Appearance: She is well-developed. She is not ill-appearing or diaphoretic.  HENT:     Head: Normocephalic and atraumatic.  Cardiovascular:     Rate and Rhythm: Normal rate and regular rhythm.     Heart sounds: Normal heart sounds.  Pulmonary:     Effort: Pulmonary effort is normal.     Breath sounds: Normal breath sounds.  Abdominal:     Palpations: Abdomen is soft.     Tenderness: There is abdominal tenderness (mild) in the periumbilical area.  Skin:    General: Skin is warm and dry.  Neurological:     Mental  Status: She is alert.     (all labs ordered are listed, but only abnormal results are displayed) Labs Reviewed  CBC WITH DIFFERENTIAL/PLATELET - Abnormal; Notable for the following components:      Result Value   RBC 5.13 (*)    MCH 25.9 (*)    All other components within normal limits  COMPREHENSIVE METABOLIC PANEL WITH GFR - Abnormal; Notable for the following components:   Glucose, Bld 113 (*)    All other components within normal limits  TROPONIN I (HIGH SENSITIVITY)    EKG: EKG Interpretation Date/Time:  Saturday November 18 2024 12:05:10 EST Ventricular Rate:  59 PR Interval:  180 QRS Duration:  92 QT Interval:  463 QTC Calculation: 459 R Axis:   -70  Text Interpretation: Sinus rhythm Ventricular  premature complex Probable left atrial enlargement Left anterior fascicular block Low voltage, precordial leads Abnormal R-wave progression, early transition  overall similar to Nov 2025 Confirmed by Freddi Hamilton [phone]) on 11/18/2024 12:10:06 PM  Radiology: CT ABDOMEN PELVIS W CONTRAST Result Date: 11/18/2024 EXAM: CT ABDOMEN AND PELVIS WITH CONTRAST 11/18/2024 02:57:28 PM TECHNIQUE: CT of the abdomen and pelvis was performed with the administration of 75 mL of iohexol  (OMNIPAQUE ) 350 MG/ML injection. Multiplanar reformatted images are provided for review. Automated exposure control, iterative reconstruction, and/or weight-based adjustment of the mA/kV was utilized to reduce the radiation dose to as low as reasonably achievable. COMPARISON: CT abdomen and pelvis 08/04/2023. CLINICAL HISTORY: Bowel obstruction suspected. FINDINGS: LOWER CHEST: There is atelectasis in the bilateral lung bases. LIVER: The liver is unremarkable. GALLBLADDER AND BILE DUCTS: Gallbladder is unremarkable. No biliary ductal dilatation. SPLEEN: No acute abnormality. PANCREAS: No acute abnormality. ADRENAL GLANDS: No acute abnormality. KIDNEYS, URETERS AND BLADDER: No stones in the kidneys or ureters. No hydronephrosis. No perinephric or periureteral stranding. Urinary bladder is unremarkable. GI AND BOWEL: Stomach demonstrates no acute abnormality. There is no bowel obstruction. The appendix is not visualized. There is a large amount of stool distending the rectum. PERITONEUM AND RETROPERITONEUM: No ascites. No free air. VASCULATURE: Aorta is normal in caliber. There are atherosclerotic calcifications of the aorta. LYMPH NODES: No lymphadenopathy. REPRODUCTIVE ORGANS: Punctate calcifications in the uterus are likely related to fibroids. There is a small amount of fluid and air in the vagina. Adnexa are within normal limits. BONES AND SOFT TISSUES: Degenerative changes affect the spine and hips. No focal soft tissue abnormality.  IMPRESSION: 1. No acute bowel obstruction. 2. Large amount of stool distending the rectum, compatible with fecal impaction or constipation. Electronically signed by: Greig Pique MD 11/18/2024 03:12 PM EST RP Workstation: [id number]   DG Chest Portable 1 View Result Date: 11/18/2024 EXAM: 1 VIEW(S) XRAY OF THE CHEST 11/18/2024 12:24:00 PM COMPARISON: 10/31/2024 CLINICAL HISTORY: 83 year old female with syncope. FINDINGS: LUNGS AND PLEURA: No focal pulmonary opacity. No pleural effusion. No pneumothorax. HEART AND MEDIASTINUM: Aortic arch atherosclerosis. BONES AND SOFT TISSUES: No acute osseous abnormality. Negative visible bowel gas pattern. IMPRESSION: 1. No acute cardiopulmonary abnormality. Electronically signed by: Helayne Hurst MD 11/18/2024 12:49 PM EST RP Workstation: [id number]     Procedures   Medications Ordered in the ED  lactated ringers  bolus 500 mL (500 mLs Intravenous New Bag/Given 11/18/24 1544)  iohexol  (OMNIPAQUE ) 350 MG/ML injection 75 mL (75 mLs Intravenous Contrast Given 11/18/24 1452)  Medical Decision Making Amount and/or Complexity of Data Reviewed Labs: ordered.    Details: Normal WBC, normal troponin Radiology: ordered and independent interpretation performed.    Details: Large amount of stool ECG/medicine tests: ordered and independent interpretation performed.    Details: Sinus rhythm  Risk OTC drugs. Prescription drug management.   Patient presents with syncope that sounds vasovagal from trying to have a bowel movement.  Daughter is now at the bedside and states that the patient has a chronic history of constipation and chronic history of abdominal pain.  Given she is tender here, CT was obtained that shows a large amount of stool and questionable fecal impaction.  I did do a rectal exam with the nurse as a chaperone and there is mushy stool but no true impaction on my exam.  She also did not tolerate this well and after  discussion with daughter, I do not think it is helpful to try to sedate or hold her down to do any type of disimpaction or enema.  Patient has a longstanding history of constipation but is on no treatment at her facility.  Will start MiraLAX  and Colace and discussed that if she does not get better she needs to return but otherwise can follow-up with PCP.  From a syncope standpoint, certainly there could have been an arrhythmia given her age but this sounds much more likely to be vasovagal syncope and daughter agrees.  At this point no further workup indicated.  Will have her follow-up with PCP and give return precautions.      Final diagnoses:  Vasovagal syncope  Constipation, unspecified constipation type    ED Discharge Orders          Ordered    polyethylene glycol (MIRALAX  / GLYCOLAX ) 17 g packet  Daily        11/18/24 1546    docusate sodium  (COLACE) 100 MG capsule  Every 12 hours        11/18/24 1546               Freddi Hamilton, MD 11/18/24 1552

## 2024-11-18 NOTE — ED Notes (Signed)
 [facility] called

## 2024-11-18 NOTE — Discharge Instructions (Signed)
 Increase your fluid intake at home as this will help with constipation and overall health.  Start the MiraLAX  and Colace for constipation.  Follow-up with your primary care provider.  Return to the ER for new or worsening symptoms.

## 2024-11-18 NOTE — ED Triage Notes (Signed)
 Pt BIB [facility] from [facility] d/t a syncopal episode. Staff reported the pt was having trouble passing stool, the staff went in there to help. She strained then passed out for . The staff assisted the pt down to the floor. Did not hit anywhere. No injuries. Pt has severe dementia at baseline.

## 2024-11-19 NOTE — ED Notes (Signed)
 Facility called multiple times to give report @ [phone] no answer. DON Wendy aware that patient is returning to facility.
